# Patient Record
Sex: Female | Born: 1945 | Race: Black or African American | Hispanic: No | Marital: Married | Smoking: Never smoker
Health system: Southern US, Community
[De-identification: ages and names within clinical notes are randomized; demographics above are authoritative.]

## PROBLEM LIST (undated history)

## (undated) DIAGNOSIS — J9690 Respiratory failure, unspecified, unspecified whether with hypoxia or hypercapnia: Secondary | ICD-10-CM

## (undated) HISTORY — PX: TRACHEOSTOMY: SUR1362

---

## 2004-04-04 ENCOUNTER — Emergency Department (HOSPITAL_COMMUNITY): Admission: EM | Admit: 2004-04-04 | Discharge: 2004-04-04 | Payer: Self-pay | Admitting: Emergency Medicine

## 2010-01-24 ENCOUNTER — Ambulatory Visit: Payer: Self-pay | Admitting: Radiology

## 2010-01-24 ENCOUNTER — Emergency Department (HOSPITAL_BASED_OUTPATIENT_CLINIC_OR_DEPARTMENT_OTHER): Admission: EM | Admit: 2010-01-24 | Discharge: 2010-01-24 | Payer: Self-pay | Admitting: Emergency Medicine

## 2013-10-10 ENCOUNTER — Encounter: Payer: Self-pay | Admitting: Podiatry

## 2013-10-10 ENCOUNTER — Ambulatory Visit (INDEPENDENT_AMBULATORY_CARE_PROVIDER_SITE_OTHER): Payer: Medicare Other | Admitting: Podiatry

## 2013-10-10 VITALS — BP 116/70 | HR 66 | Ht 64.0 in | Wt 155.0 lb

## 2013-10-10 DIAGNOSIS — M79609 Pain in unspecified limb: Secondary | ICD-10-CM

## 2013-10-10 DIAGNOSIS — M79673 Pain in unspecified foot: Secondary | ICD-10-CM | POA: Insufficient documentation

## 2013-10-10 DIAGNOSIS — M792 Neuralgia and neuritis, unspecified: Secondary | ICD-10-CM | POA: Insufficient documentation

## 2013-10-10 DIAGNOSIS — M79671 Pain in right foot: Secondary | ICD-10-CM

## 2013-10-10 DIAGNOSIS — G5790 Unspecified mononeuropathy of unspecified lower limb: Secondary | ICD-10-CM

## 2013-10-10 NOTE — Progress Notes (Signed)
Subjective: Has had left ankle surgery following a injury in May 2014.  Two days ago she was in extreme pain on right foot at 8 pm as if someone is stabbing the top of right foot. She placed Ice bag and had no more problem. It happened again yesterday at 7 pm. This morning it happened 5:45 am. She used Ice bag with Alcohol and water (3:1), moldable. It was 100 times worse pain than the broken ankle.  Also had right 4th toe injury years ago that left it numb for the last 20 years. Since this painful episode the feeling came back in the 4th toe right.   Objective: Mildly swollen left ankle following the last surgery. Right foot examination reveal forefoot varus, hypermobile first ray, plantar calluses under the lesser MPJ. No visible edema or discoloration on right foot affected distal 1/2 of 5th Metatarsal shaft.  Normal range of motion. Neurovascular status are within normal.  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  Plan:  Reviewed clinical findings and available treatment options. Metatarsal binder dispensed to stabilize the first ray. Will benefit from Custom orthotics.

## 2013-10-10 NOTE — Patient Instructions (Signed)
Seen for right foot pain. Possible Neuralgia of digital branch of lateral dorsal cutaneous nerve 4th intermetatarsal space secondary to unstable first ray. May benefit from Metatarsal binder and Orthotic shoe inserts.

## 2014-10-27 ENCOUNTER — Ambulatory Visit (INDEPENDENT_AMBULATORY_CARE_PROVIDER_SITE_OTHER): Payer: Medicare Other | Admitting: Physical Therapy

## 2014-10-27 DIAGNOSIS — R5381 Other malaise: Secondary | ICD-10-CM

## 2014-10-27 DIAGNOSIS — M25519 Pain in unspecified shoulder: Secondary | ICD-10-CM

## 2014-10-27 DIAGNOSIS — M129 Arthropathy, unspecified: Secondary | ICD-10-CM | POA: Diagnosis present

## 2014-10-30 ENCOUNTER — Encounter (INDEPENDENT_AMBULATORY_CARE_PROVIDER_SITE_OTHER): Payer: Medicare Other | Admitting: Physical Therapy

## 2014-10-30 DIAGNOSIS — M25519 Pain in unspecified shoulder: Secondary | ICD-10-CM

## 2014-10-30 DIAGNOSIS — M129 Arthropathy, unspecified: Secondary | ICD-10-CM

## 2014-10-30 DIAGNOSIS — R5381 Other malaise: Secondary | ICD-10-CM

## 2014-11-03 ENCOUNTER — Encounter (INDEPENDENT_AMBULATORY_CARE_PROVIDER_SITE_OTHER): Payer: Medicare Other | Admitting: Physical Therapy

## 2014-11-03 DIAGNOSIS — M25519 Pain in unspecified shoulder: Secondary | ICD-10-CM

## 2014-11-03 DIAGNOSIS — R5381 Other malaise: Secondary | ICD-10-CM

## 2014-11-03 DIAGNOSIS — M129 Arthropathy, unspecified: Secondary | ICD-10-CM | POA: Diagnosis present

## 2014-11-05 ENCOUNTER — Encounter: Payer: Medicare Other | Admitting: Physical Therapy

## 2014-11-06 ENCOUNTER — Encounter (INDEPENDENT_AMBULATORY_CARE_PROVIDER_SITE_OTHER): Payer: Medicare Other | Admitting: Physical Therapy

## 2014-11-06 DIAGNOSIS — M129 Arthropathy, unspecified: Secondary | ICD-10-CM

## 2014-11-06 DIAGNOSIS — M25519 Pain in unspecified shoulder: Secondary | ICD-10-CM | POA: Diagnosis not present

## 2014-11-06 DIAGNOSIS — R5381 Other malaise: Secondary | ICD-10-CM

## 2014-11-11 ENCOUNTER — Encounter (INDEPENDENT_AMBULATORY_CARE_PROVIDER_SITE_OTHER): Payer: Medicare Other | Admitting: Physical Therapy

## 2014-11-11 DIAGNOSIS — M25519 Pain in unspecified shoulder: Secondary | ICD-10-CM | POA: Diagnosis not present

## 2014-11-11 DIAGNOSIS — M129 Arthropathy, unspecified: Secondary | ICD-10-CM

## 2014-11-11 DIAGNOSIS — R5381 Other malaise: Secondary | ICD-10-CM | POA: Diagnosis not present

## 2014-11-13 ENCOUNTER — Encounter (INDEPENDENT_AMBULATORY_CARE_PROVIDER_SITE_OTHER): Payer: Medicare Other | Admitting: Physical Therapy

## 2014-11-13 DIAGNOSIS — M25519 Pain in unspecified shoulder: Secondary | ICD-10-CM | POA: Diagnosis not present

## 2014-11-13 DIAGNOSIS — M129 Arthropathy, unspecified: Secondary | ICD-10-CM

## 2014-11-13 DIAGNOSIS — R5381 Other malaise: Secondary | ICD-10-CM | POA: Diagnosis not present

## 2014-11-25 ENCOUNTER — Encounter: Payer: Medicare Other | Admitting: Physical Therapy

## 2014-11-27 ENCOUNTER — Encounter (INDEPENDENT_AMBULATORY_CARE_PROVIDER_SITE_OTHER): Payer: Medicare Other | Admitting: Physical Therapy

## 2014-11-27 DIAGNOSIS — R5381 Other malaise: Secondary | ICD-10-CM

## 2014-11-27 DIAGNOSIS — M25519 Pain in unspecified shoulder: Secondary | ICD-10-CM

## 2014-11-27 DIAGNOSIS — M129 Arthropathy, unspecified: Secondary | ICD-10-CM | POA: Diagnosis not present

## 2014-12-02 ENCOUNTER — Encounter (INDEPENDENT_AMBULATORY_CARE_PROVIDER_SITE_OTHER): Payer: Medicare Other | Admitting: Physical Therapy

## 2014-12-02 DIAGNOSIS — M25519 Pain in unspecified shoulder: Secondary | ICD-10-CM

## 2014-12-02 DIAGNOSIS — M129 Arthropathy, unspecified: Secondary | ICD-10-CM | POA: Diagnosis present

## 2014-12-02 DIAGNOSIS — R5381 Other malaise: Secondary | ICD-10-CM

## 2014-12-04 ENCOUNTER — Encounter (INDEPENDENT_AMBULATORY_CARE_PROVIDER_SITE_OTHER): Payer: Medicare Other | Admitting: Physical Therapy

## 2014-12-04 DIAGNOSIS — M129 Arthropathy, unspecified: Secondary | ICD-10-CM | POA: Diagnosis not present

## 2014-12-04 DIAGNOSIS — R5381 Other malaise: Secondary | ICD-10-CM

## 2014-12-04 DIAGNOSIS — M25519 Pain in unspecified shoulder: Secondary | ICD-10-CM | POA: Diagnosis not present

## 2014-12-08 ENCOUNTER — Encounter (INDEPENDENT_AMBULATORY_CARE_PROVIDER_SITE_OTHER): Payer: Medicare Other | Admitting: Physical Therapy

## 2014-12-08 DIAGNOSIS — M129 Arthropathy, unspecified: Secondary | ICD-10-CM

## 2014-12-08 DIAGNOSIS — M25519 Pain in unspecified shoulder: Secondary | ICD-10-CM | POA: Diagnosis not present

## 2014-12-08 DIAGNOSIS — M5381 Other specified dorsopathies, occipito-atlanto-axial region: Secondary | ICD-10-CM | POA: Diagnosis not present

## 2014-12-10 ENCOUNTER — Encounter (INDEPENDENT_AMBULATORY_CARE_PROVIDER_SITE_OTHER): Payer: Medicare Other | Admitting: Physical Therapy

## 2014-12-10 DIAGNOSIS — M25519 Pain in unspecified shoulder: Secondary | ICD-10-CM | POA: Diagnosis not present

## 2014-12-10 DIAGNOSIS — M129 Arthropathy, unspecified: Secondary | ICD-10-CM

## 2014-12-10 DIAGNOSIS — R5381 Other malaise: Secondary | ICD-10-CM | POA: Diagnosis not present

## 2016-09-18 ENCOUNTER — Other Ambulatory Visit: Payer: Self-pay | Admitting: Cardiology

## 2016-09-18 DIAGNOSIS — I720 Aneurysm of carotid artery: Secondary | ICD-10-CM

## 2016-10-02 ENCOUNTER — Ambulatory Visit
Admission: RE | Admit: 2016-10-02 | Discharge: 2016-10-02 | Disposition: A | Discharge status: Home / Self Care | Payer: Medicare Other | Source: Ambulatory Visit | Attending: Cardiology | Admitting: Cardiology

## 2016-10-02 DIAGNOSIS — I720 Aneurysm of carotid artery: Secondary | ICD-10-CM

## 2017-12-05 IMAGING — US US CAROTID DUPLEX BILAT
1 series · 13 of 24 positions shown · non-contrast
Comparison: None.

CLINICAL DATA: Pulsatile area in the right neck. Evaluate for
possible aneurysm.

EXAM:
BILATERAL CAROTID DUPLEX ULTRASOUND
TECHNIQUE: Gray scale imaging, color Doppler and duplex ultrasound were
performed of bilateral carotid and vertebral arteries in the neck.

[Series 1: us carotid duplex bilat · 0.08mm/px · 13 of 62 slices shown]
[im 1/62]
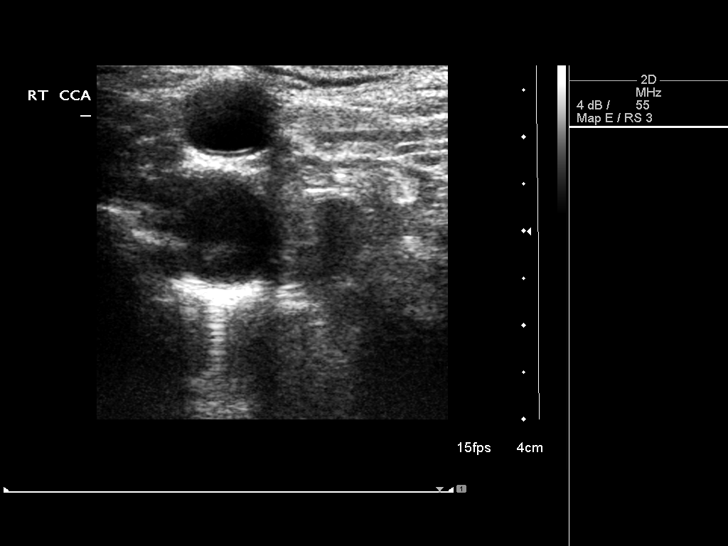
[im 6/62]
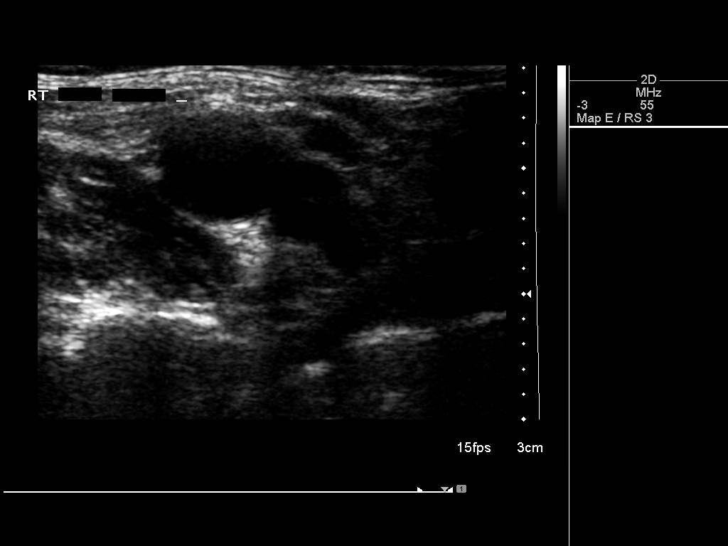
[im 11/62]
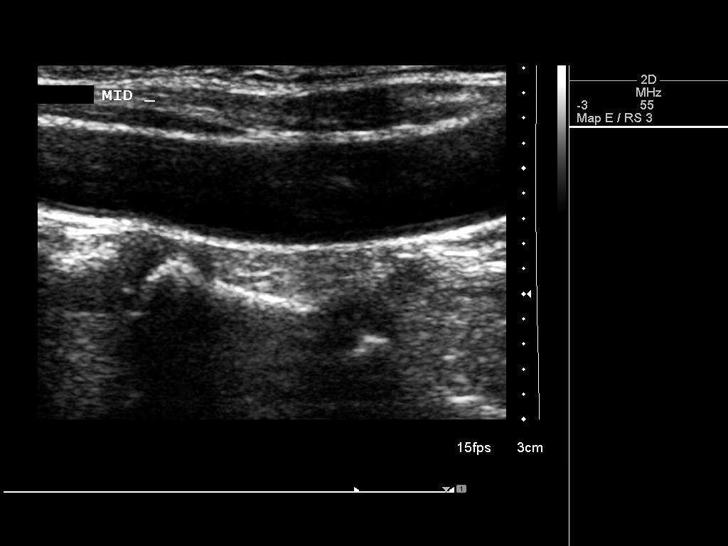
[im 16/62]
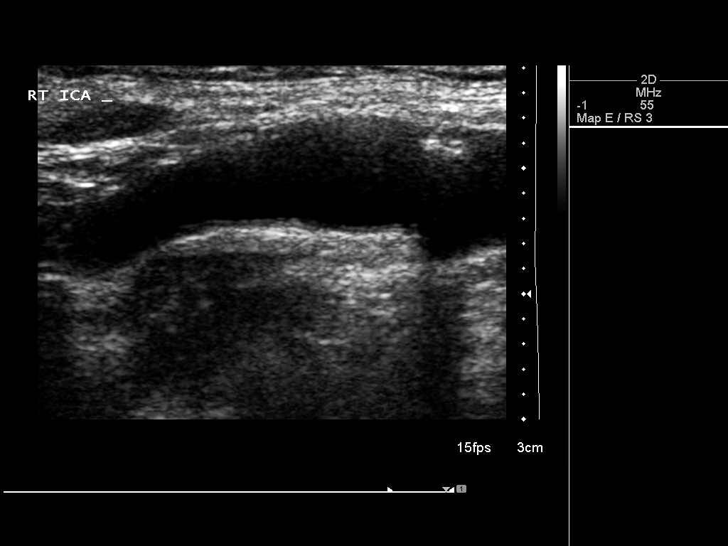
[im 22/62]
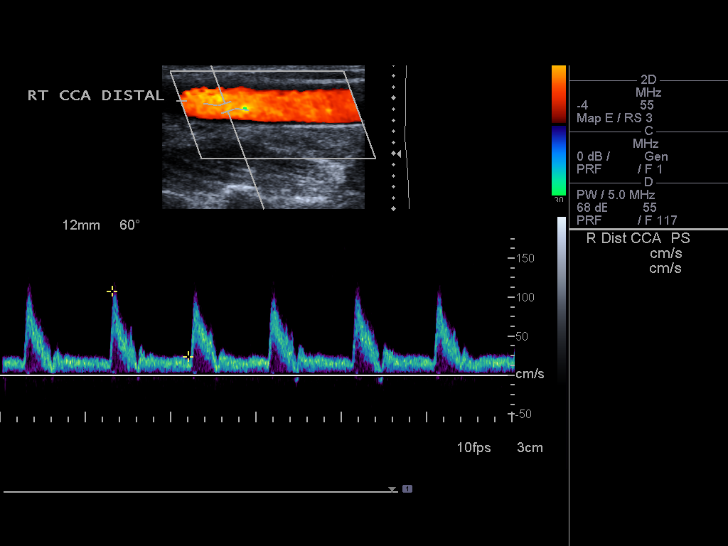
[im 27/62]
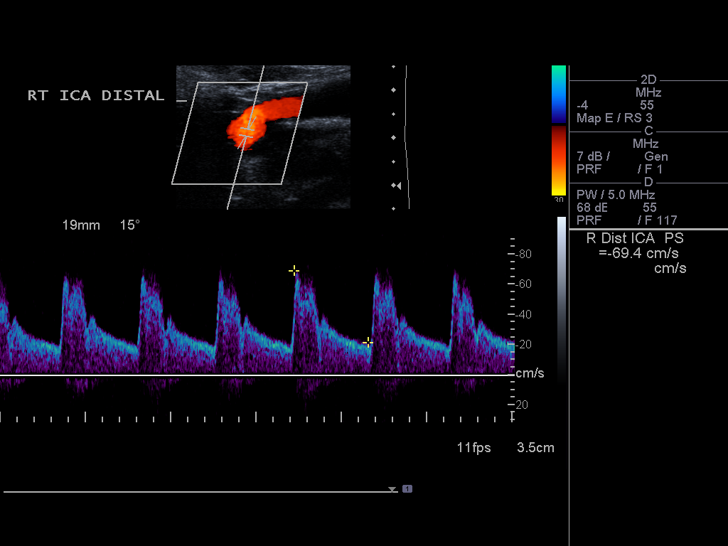
[im 32/62]
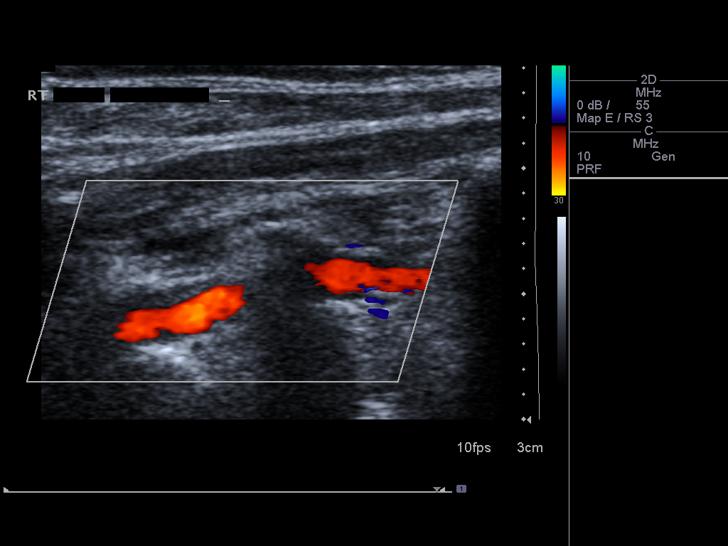
[im 35/62]
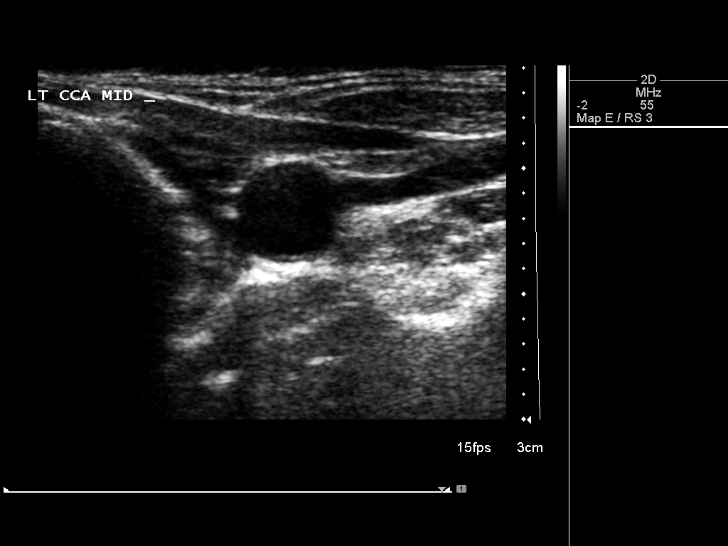
[im 40/62]
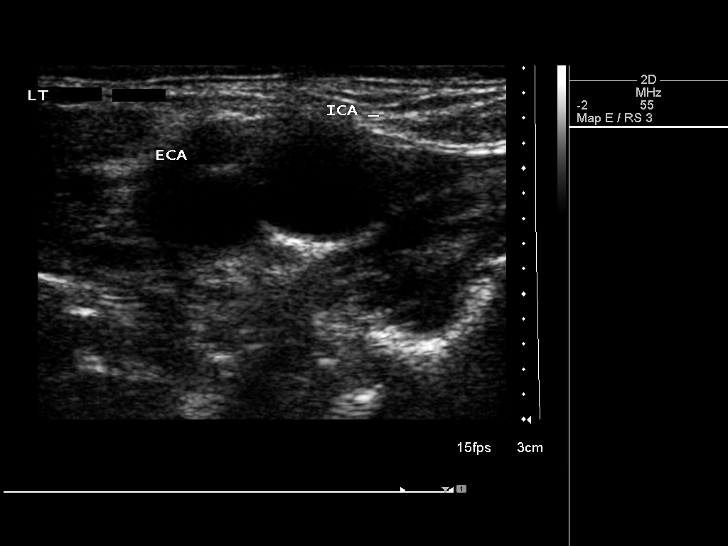
[im 46/62]
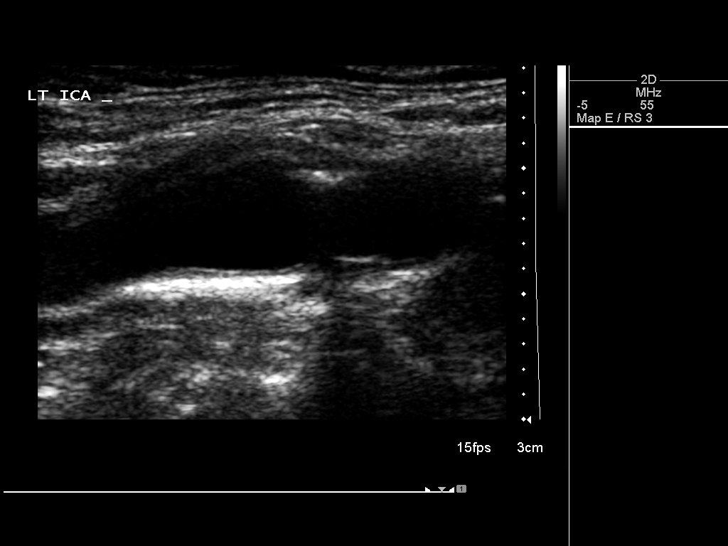
[im 51/62]
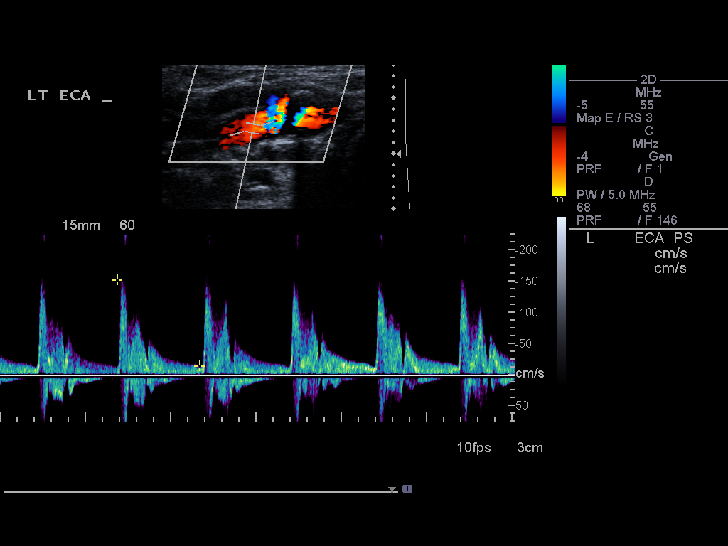
[im 56/62]
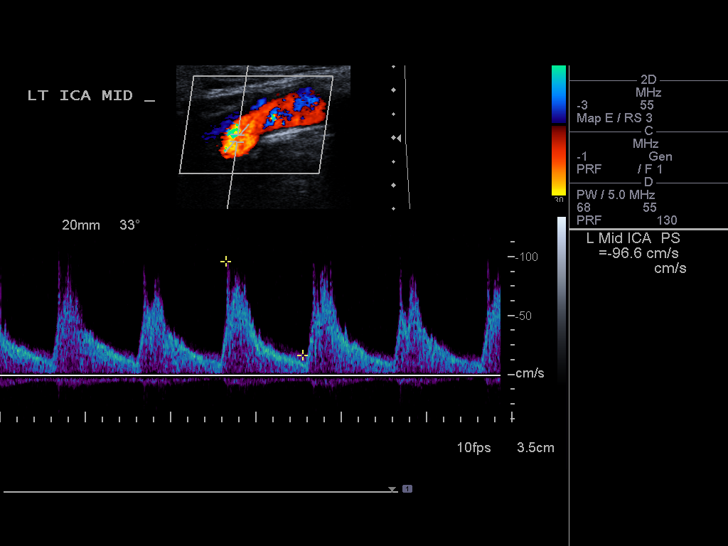
[im 62/62]
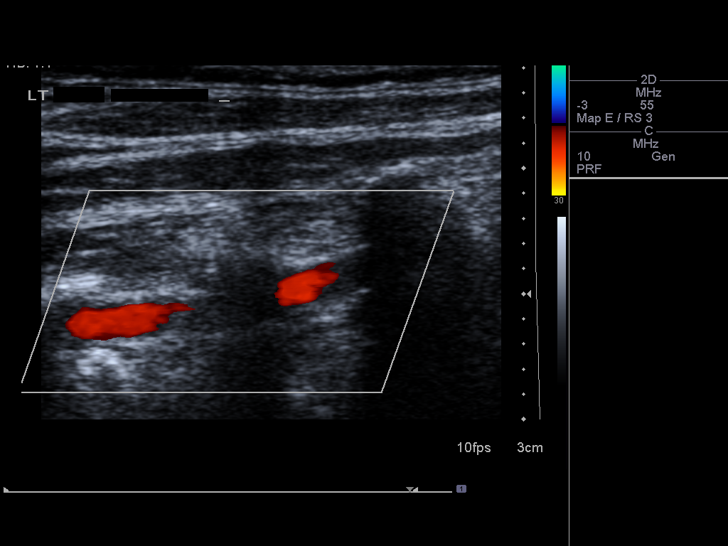

[13 of 24 positions shown; findings below may reference images not displayed]

FINDINGS: Criteria: Quantification of carotid stenosis is based on velocity
parameters that correlate the residual internal carotid diameter
with NASCET-based stenosis levels, using the diameter of the distal
internal carotid lumen as the denominator for stenosis measurement.

The following velocity measurements were obtained:

RIGHT

ICA:  93 cm/sec

CCA:  122 cm/sec

SYSTOLIC ICA/CCA RATIO:

DIASTOLIC ICA/CCA RATIO:

ECA:  93 cm/sec

LEFT

ICA:  97 cm/sec

CCA:  96 cm/sec

SYSTOLIC ICA/CCA RATIO:

DIASTOLIC ICA/CCA RATIO:

ECA:  156 cm/sec

RIGHT CAROTID ARTERY: Small amount of echogenic plaque at the right
carotid bulb and proximal internal carotid artery. Normal waveforms
and velocities in the right internal carotid artery. The pulsatile
area corresponds with the proximal common carotid artery. The right
common carotid artery is superficial at this location but there is
no evidence for an aneurysm.

RIGHT VERTEBRAL ARTERY: Antegrade flow and normal waveform in the
right vertebral artery.

LEFT CAROTID ARTERY: Small amount of echogenic plaque at the left
carotid bulb and proximal internal carotid artery. Normal waveforms
and velocities in the left internal carotid artery.

LEFT VERTEBRAL ARTERY: Antegrade flow and normal waveform in the
left vertebral artery.
IMPRESSION: Mild atherosclerotic disease in the carotid arteries. Estimated
degree of stenosis in the internal carotid arteries is less than 50%
bilaterally.

Patent vertebral arteries with antegrade flow.

The proximal right common carotid artery corresponds with pulsatile
area on the right side of the neck. The artery superficial in this
location but no aneurysm.

## 2018-05-01 ENCOUNTER — Encounter: Payer: Self-pay | Admitting: Podiatry

## 2018-05-01 ENCOUNTER — Ambulatory Visit (INDEPENDENT_AMBULATORY_CARE_PROVIDER_SITE_OTHER): Payer: Medicare Other | Admitting: Podiatry

## 2018-05-01 VITALS — BP 169/77 | HR 78 | Ht 64.0 in | Wt 162.0 lb

## 2018-05-01 DIAGNOSIS — M79671 Pain in right foot: Secondary | ICD-10-CM | POA: Diagnosis not present

## 2018-05-01 DIAGNOSIS — M2041 Other hammer toe(s) (acquired), right foot: Secondary | ICD-10-CM

## 2018-05-01 DIAGNOSIS — M79672 Pain in left foot: Secondary | ICD-10-CM

## 2018-05-01 NOTE — Patient Instructions (Signed)
Seen for digital deformities. Noted of mild ankle edema left, contracted 2nd and 4th right, recurrent digital contracture 5th bilateral.

## 2018-05-01 NOTE — Progress Notes (Signed)
Subjective: 72 year old female presents requesting foot examination for aching feet. Having black marks that started in March since wearing boots. Patient states the 2nd and 4th toe right foot are contracted and hurts in shoes.  Recent surgical history of Uterine polyps removal in August 21, 2017 under general anesthesia. Just had colonoscopy done. Under medical management for severe Hypertension. Today was 169/77.  HPI: Left ankle surgery following a injury in May 2014.  She was seen on 10/10/13 for right foot pain. She was diagnosed as a Neuralgia of dorsolateral cutaneous nerve of digital branch 4th intermetatarsal space right foot and treated with Metatarsal binder with recommendation of custom orthotics. She failed to return. Positive for right 4th toe injury years ago that left it numb for the last 20 years. Since then the feeling has come back.  Review of Systems  Constitutional: Negative.   HENT: Negative.   Eyes: Negative.   Respiratory: Negative.   Cardiovascular: Negative for chest pain, palpitations and orthopnea.       Diagnosed with Atrial tachycardia.  Gastrointestinal: Negative.   Genitourinary: Negative.   Musculoskeletal:       Post operative left ankle edema. Plate and screw fixation on left ankle following injury May, 2014.  Skin: Negative.    Objective: Dermatologic: No open skin lesions.. Vascular: All pedal pulses are palpable in normal pulsation. Mild ankle edema left, asymptomatic Neurologic: All epicritic and tactile sensations are grossly intact.  Orthopedic: S/P Hammer toe repair 5th with recurrent contracture. Multiple digital contracture, 2nd and 4th right. Forefoot varus, hypermobile first ray bilateral. Range of motion of forefoot and rearfoot are within normal. Muscle strength is normal in forefoot and rearfoot.  Radiographic examination reveal elevated first ray bilateral. Digital contracture lesser digits 2-5 bilateral. No acute changes in  osseous or articular surfaces bilateral.  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 Mild Hallux valgus with bunion bilateral.  Plan:  Reviewed clinical findings and available treatment options. As per request, surgery consent form reviewed for Hammer toe repair 2nd and 4 th bilateral.

## 2018-05-06 ENCOUNTER — Telehealth: Payer: Self-pay | Admitting: *Deleted

## 2018-05-06 NOTE — Telephone Encounter (Signed)
Patient has medicare and BCBS supplement. The supplement covers deductible and coinsurance. Medicare covers 80%. Went on [employer] website and for CPT code 16109 the estimated cost patient pays in an outpatient facility is $285.

## 2018-05-07 NOTE — Telephone Encounter (Signed)
Supplement covers $185 deductible and 20% medicare approved amount and 100%of the amount billed by doctor approved by medicare

## 2018-05-09 NOTE — Telephone Encounter (Signed)
Called patient 05/13 and today 05/09/2018 and left messages to go over benefits

## 2018-05-17 NOTE — Telephone Encounter (Signed)
Patient called and left a message earlier this week stating she could not do the surgery at this time. She will call when she is ready to have the surgery

## 2024-04-03 DIAGNOSIS — R5381 Other malaise: Secondary | ICD-10-CM

## 2024-04-03 DIAGNOSIS — J189 Pneumonia, unspecified organism: Secondary | ICD-10-CM | POA: Diagnosis not present

## 2024-04-03 DIAGNOSIS — J9602 Acute respiratory failure with hypercapnia: Secondary | ICD-10-CM | POA: Diagnosis not present

## 2024-04-03 DIAGNOSIS — G1221 Amyotrophic lateral sclerosis: Secondary | ICD-10-CM | POA: Diagnosis not present

## 2024-04-03 DIAGNOSIS — E43 Unspecified severe protein-calorie malnutrition: Secondary | ICD-10-CM

## 2024-04-03 DIAGNOSIS — R627 Adult failure to thrive: Secondary | ICD-10-CM | POA: Diagnosis not present

## 2024-04-04 DIAGNOSIS — J189 Pneumonia, unspecified organism: Secondary | ICD-10-CM | POA: Diagnosis not present

## 2024-04-04 DIAGNOSIS — J9602 Acute respiratory failure with hypercapnia: Secondary | ICD-10-CM | POA: Diagnosis not present

## 2024-04-04 DIAGNOSIS — G1221 Amyotrophic lateral sclerosis: Secondary | ICD-10-CM | POA: Diagnosis not present

## 2024-04-04 DIAGNOSIS — R627 Adult failure to thrive: Secondary | ICD-10-CM | POA: Diagnosis not present

## 2024-04-05 DIAGNOSIS — G1221 Amyotrophic lateral sclerosis: Secondary | ICD-10-CM | POA: Diagnosis not present

## 2024-04-05 DIAGNOSIS — J9602 Acute respiratory failure with hypercapnia: Secondary | ICD-10-CM | POA: Diagnosis not present

## 2024-04-05 DIAGNOSIS — J189 Pneumonia, unspecified organism: Secondary | ICD-10-CM | POA: Diagnosis not present

## 2024-04-05 DIAGNOSIS — R627 Adult failure to thrive: Secondary | ICD-10-CM | POA: Diagnosis not present

## 2024-04-06 DIAGNOSIS — J189 Pneumonia, unspecified organism: Secondary | ICD-10-CM | POA: Diagnosis not present

## 2024-04-06 DIAGNOSIS — J9602 Acute respiratory failure with hypercapnia: Secondary | ICD-10-CM | POA: Diagnosis not present

## 2024-04-06 DIAGNOSIS — R627 Adult failure to thrive: Secondary | ICD-10-CM | POA: Diagnosis not present

## 2024-04-06 DIAGNOSIS — G1221 Amyotrophic lateral sclerosis: Secondary | ICD-10-CM | POA: Diagnosis not present

## 2024-04-07 DIAGNOSIS — R627 Adult failure to thrive: Secondary | ICD-10-CM | POA: Diagnosis not present

## 2024-04-07 DIAGNOSIS — G1221 Amyotrophic lateral sclerosis: Secondary | ICD-10-CM

## 2024-04-07 DIAGNOSIS — R5381 Other malaise: Secondary | ICD-10-CM

## 2024-04-07 DIAGNOSIS — J189 Pneumonia, unspecified organism: Secondary | ICD-10-CM | POA: Diagnosis not present

## 2024-04-07 DIAGNOSIS — Z930 Tracheostomy status: Secondary | ICD-10-CM | POA: Diagnosis not present

## 2024-04-07 DIAGNOSIS — J9602 Acute respiratory failure with hypercapnia: Secondary | ICD-10-CM

## 2024-04-07 DIAGNOSIS — J9621 Acute and chronic respiratory failure with hypoxia: Secondary | ICD-10-CM | POA: Diagnosis not present

## 2024-04-07 DIAGNOSIS — E43 Unspecified severe protein-calorie malnutrition: Secondary | ICD-10-CM | POA: Diagnosis not present

## 2024-04-08 DIAGNOSIS — R627 Adult failure to thrive: Secondary | ICD-10-CM | POA: Diagnosis not present

## 2024-04-08 DIAGNOSIS — E43 Unspecified severe protein-calorie malnutrition: Secondary | ICD-10-CM | POA: Diagnosis not present

## 2024-04-08 DIAGNOSIS — G1221 Amyotrophic lateral sclerosis: Secondary | ICD-10-CM

## 2024-04-08 DIAGNOSIS — Z930 Tracheostomy status: Secondary | ICD-10-CM | POA: Diagnosis not present

## 2024-04-08 DIAGNOSIS — J9621 Acute and chronic respiratory failure with hypoxia: Secondary | ICD-10-CM | POA: Diagnosis not present

## 2024-04-08 DIAGNOSIS — J189 Pneumonia, unspecified organism: Secondary | ICD-10-CM | POA: Diagnosis not present

## 2024-04-09 DIAGNOSIS — E43 Unspecified severe protein-calorie malnutrition: Secondary | ICD-10-CM | POA: Diagnosis not present

## 2024-04-09 DIAGNOSIS — J189 Pneumonia, unspecified organism: Secondary | ICD-10-CM | POA: Diagnosis not present

## 2024-04-09 DIAGNOSIS — R627 Adult failure to thrive: Secondary | ICD-10-CM | POA: Diagnosis not present

## 2024-04-09 DIAGNOSIS — Z930 Tracheostomy status: Secondary | ICD-10-CM | POA: Diagnosis not present

## 2024-04-09 DIAGNOSIS — J9621 Acute and chronic respiratory failure with hypoxia: Secondary | ICD-10-CM | POA: Diagnosis not present

## 2024-04-09 DIAGNOSIS — G1221 Amyotrophic lateral sclerosis: Secondary | ICD-10-CM

## 2024-04-10 DIAGNOSIS — J9621 Acute and chronic respiratory failure with hypoxia: Secondary | ICD-10-CM | POA: Diagnosis not present

## 2024-04-10 DIAGNOSIS — Z930 Tracheostomy status: Secondary | ICD-10-CM | POA: Diagnosis not present

## 2024-04-10 DIAGNOSIS — E43 Unspecified severe protein-calorie malnutrition: Secondary | ICD-10-CM | POA: Diagnosis not present

## 2024-04-10 DIAGNOSIS — R627 Adult failure to thrive: Secondary | ICD-10-CM | POA: Diagnosis not present

## 2024-04-10 DIAGNOSIS — G1221 Amyotrophic lateral sclerosis: Secondary | ICD-10-CM

## 2024-04-10 DIAGNOSIS — J189 Pneumonia, unspecified organism: Secondary | ICD-10-CM | POA: Diagnosis not present

## 2024-04-11 DIAGNOSIS — J9621 Acute and chronic respiratory failure with hypoxia: Secondary | ICD-10-CM | POA: Diagnosis not present

## 2024-04-11 DIAGNOSIS — E43 Unspecified severe protein-calorie malnutrition: Secondary | ICD-10-CM | POA: Diagnosis not present

## 2024-04-11 DIAGNOSIS — G1221 Amyotrophic lateral sclerosis: Secondary | ICD-10-CM

## 2024-04-11 DIAGNOSIS — J189 Pneumonia, unspecified organism: Secondary | ICD-10-CM | POA: Diagnosis not present

## 2024-04-11 DIAGNOSIS — R627 Adult failure to thrive: Secondary | ICD-10-CM | POA: Diagnosis not present

## 2024-04-11 DIAGNOSIS — Z930 Tracheostomy status: Secondary | ICD-10-CM | POA: Diagnosis not present

## 2024-04-12 DIAGNOSIS — Z930 Tracheostomy status: Secondary | ICD-10-CM | POA: Diagnosis not present

## 2024-04-12 DIAGNOSIS — J9621 Acute and chronic respiratory failure with hypoxia: Secondary | ICD-10-CM | POA: Diagnosis not present

## 2024-04-12 DIAGNOSIS — G1221 Amyotrophic lateral sclerosis: Secondary | ICD-10-CM

## 2024-04-12 DIAGNOSIS — R627 Adult failure to thrive: Secondary | ICD-10-CM | POA: Diagnosis not present

## 2024-04-12 DIAGNOSIS — J189 Pneumonia, unspecified organism: Secondary | ICD-10-CM | POA: Diagnosis not present

## 2024-04-12 DIAGNOSIS — E43 Unspecified severe protein-calorie malnutrition: Secondary | ICD-10-CM | POA: Diagnosis not present

## 2024-04-13 DIAGNOSIS — Z930 Tracheostomy status: Secondary | ICD-10-CM | POA: Diagnosis not present

## 2024-04-13 DIAGNOSIS — R627 Adult failure to thrive: Secondary | ICD-10-CM | POA: Diagnosis not present

## 2024-04-13 DIAGNOSIS — G1221 Amyotrophic lateral sclerosis: Secondary | ICD-10-CM

## 2024-04-13 DIAGNOSIS — J9621 Acute and chronic respiratory failure with hypoxia: Secondary | ICD-10-CM | POA: Diagnosis not present

## 2024-04-13 DIAGNOSIS — J189 Pneumonia, unspecified organism: Secondary | ICD-10-CM | POA: Diagnosis not present

## 2024-04-13 DIAGNOSIS — E43 Unspecified severe protein-calorie malnutrition: Secondary | ICD-10-CM | POA: Diagnosis not present

## 2024-04-14 DIAGNOSIS — J9602 Acute respiratory failure with hypercapnia: Secondary | ICD-10-CM

## 2024-04-14 DIAGNOSIS — G1221 Amyotrophic lateral sclerosis: Secondary | ICD-10-CM | POA: Diagnosis not present

## 2024-04-14 DIAGNOSIS — R627 Adult failure to thrive: Secondary | ICD-10-CM | POA: Diagnosis not present

## 2024-04-14 DIAGNOSIS — R5381 Other malaise: Secondary | ICD-10-CM

## 2024-04-14 DIAGNOSIS — J189 Pneumonia, unspecified organism: Secondary | ICD-10-CM | POA: Diagnosis not present

## 2024-04-14 DIAGNOSIS — E43 Unspecified severe protein-calorie malnutrition: Secondary | ICD-10-CM | POA: Diagnosis not present

## 2024-04-15 DIAGNOSIS — E43 Unspecified severe protein-calorie malnutrition: Secondary | ICD-10-CM | POA: Diagnosis not present

## 2024-04-15 DIAGNOSIS — R627 Adult failure to thrive: Secondary | ICD-10-CM | POA: Diagnosis not present

## 2024-04-15 DIAGNOSIS — J189 Pneumonia, unspecified organism: Secondary | ICD-10-CM | POA: Diagnosis not present

## 2024-04-15 DIAGNOSIS — G1221 Amyotrophic lateral sclerosis: Secondary | ICD-10-CM | POA: Diagnosis not present

## 2024-04-16 DIAGNOSIS — G1221 Amyotrophic lateral sclerosis: Secondary | ICD-10-CM | POA: Diagnosis not present

## 2024-04-16 DIAGNOSIS — R627 Adult failure to thrive: Secondary | ICD-10-CM | POA: Diagnosis not present

## 2024-04-16 DIAGNOSIS — E43 Unspecified severe protein-calorie malnutrition: Secondary | ICD-10-CM | POA: Diagnosis not present

## 2024-04-16 DIAGNOSIS — J189 Pneumonia, unspecified organism: Secondary | ICD-10-CM | POA: Diagnosis not present

## 2024-04-17 DIAGNOSIS — J189 Pneumonia, unspecified organism: Secondary | ICD-10-CM | POA: Diagnosis not present

## 2024-04-17 DIAGNOSIS — E43 Unspecified severe protein-calorie malnutrition: Secondary | ICD-10-CM | POA: Diagnosis not present

## 2024-04-17 DIAGNOSIS — R627 Adult failure to thrive: Secondary | ICD-10-CM | POA: Diagnosis not present

## 2024-04-17 DIAGNOSIS — G1221 Amyotrophic lateral sclerosis: Secondary | ICD-10-CM | POA: Diagnosis not present

## 2024-04-18 DIAGNOSIS — J189 Pneumonia, unspecified organism: Secondary | ICD-10-CM | POA: Diagnosis not present

## 2024-04-18 DIAGNOSIS — R627 Adult failure to thrive: Secondary | ICD-10-CM | POA: Diagnosis not present

## 2024-04-18 DIAGNOSIS — G1221 Amyotrophic lateral sclerosis: Secondary | ICD-10-CM | POA: Diagnosis not present

## 2024-04-18 DIAGNOSIS — E43 Unspecified severe protein-calorie malnutrition: Secondary | ICD-10-CM | POA: Diagnosis not present

## 2024-04-19 DIAGNOSIS — E43 Unspecified severe protein-calorie malnutrition: Secondary | ICD-10-CM | POA: Diagnosis not present

## 2024-04-19 DIAGNOSIS — G1221 Amyotrophic lateral sclerosis: Secondary | ICD-10-CM | POA: Diagnosis not present

## 2024-04-19 DIAGNOSIS — R627 Adult failure to thrive: Secondary | ICD-10-CM | POA: Diagnosis not present

## 2024-04-19 DIAGNOSIS — J189 Pneumonia, unspecified organism: Secondary | ICD-10-CM | POA: Diagnosis not present

## 2024-04-20 DIAGNOSIS — E43 Unspecified severe protein-calorie malnutrition: Secondary | ICD-10-CM | POA: Diagnosis not present

## 2024-04-20 DIAGNOSIS — G1221 Amyotrophic lateral sclerosis: Secondary | ICD-10-CM | POA: Diagnosis not present

## 2024-04-20 DIAGNOSIS — R627 Adult failure to thrive: Secondary | ICD-10-CM | POA: Diagnosis not present

## 2024-04-20 DIAGNOSIS — J189 Pneumonia, unspecified organism: Secondary | ICD-10-CM | POA: Diagnosis not present

## 2024-04-21 DIAGNOSIS — R5381 Other malaise: Secondary | ICD-10-CM

## 2024-04-21 DIAGNOSIS — J9621 Acute and chronic respiratory failure with hypoxia: Secondary | ICD-10-CM | POA: Diagnosis not present

## 2024-04-21 DIAGNOSIS — J9602 Acute respiratory failure with hypercapnia: Secondary | ICD-10-CM

## 2024-04-21 DIAGNOSIS — R627 Adult failure to thrive: Secondary | ICD-10-CM | POA: Diagnosis not present

## 2024-04-21 DIAGNOSIS — G1221 Amyotrophic lateral sclerosis: Secondary | ICD-10-CM | POA: Diagnosis not present

## 2024-04-21 DIAGNOSIS — Z930 Tracheostomy status: Secondary | ICD-10-CM | POA: Diagnosis not present

## 2024-04-21 DIAGNOSIS — J189 Pneumonia, unspecified organism: Secondary | ICD-10-CM | POA: Diagnosis not present

## 2024-04-21 DIAGNOSIS — E43 Unspecified severe protein-calorie malnutrition: Secondary | ICD-10-CM | POA: Diagnosis not present

## 2024-04-22 DIAGNOSIS — Z930 Tracheostomy status: Secondary | ICD-10-CM | POA: Diagnosis not present

## 2024-04-22 DIAGNOSIS — J9621 Acute and chronic respiratory failure with hypoxia: Secondary | ICD-10-CM | POA: Diagnosis not present

## 2024-04-22 DIAGNOSIS — G1221 Amyotrophic lateral sclerosis: Secondary | ICD-10-CM | POA: Diagnosis not present

## 2024-04-22 DIAGNOSIS — R627 Adult failure to thrive: Secondary | ICD-10-CM | POA: Diagnosis not present

## 2024-04-22 DIAGNOSIS — J189 Pneumonia, unspecified organism: Secondary | ICD-10-CM | POA: Diagnosis not present

## 2024-04-22 DIAGNOSIS — E43 Unspecified severe protein-calorie malnutrition: Secondary | ICD-10-CM | POA: Diagnosis not present

## 2024-04-23 DIAGNOSIS — Z930 Tracheostomy status: Secondary | ICD-10-CM | POA: Diagnosis not present

## 2024-04-23 DIAGNOSIS — J189 Pneumonia, unspecified organism: Secondary | ICD-10-CM | POA: Diagnosis not present

## 2024-04-23 DIAGNOSIS — G1221 Amyotrophic lateral sclerosis: Secondary | ICD-10-CM | POA: Diagnosis not present

## 2024-04-23 DIAGNOSIS — E43 Unspecified severe protein-calorie malnutrition: Secondary | ICD-10-CM | POA: Diagnosis not present

## 2024-04-23 DIAGNOSIS — R627 Adult failure to thrive: Secondary | ICD-10-CM | POA: Diagnosis not present

## 2024-04-23 DIAGNOSIS — J9621 Acute and chronic respiratory failure with hypoxia: Secondary | ICD-10-CM | POA: Diagnosis not present

## 2024-04-24 DIAGNOSIS — Z930 Tracheostomy status: Secondary | ICD-10-CM | POA: Diagnosis not present

## 2024-04-24 DIAGNOSIS — J189 Pneumonia, unspecified organism: Secondary | ICD-10-CM

## 2024-04-24 DIAGNOSIS — E43 Unspecified severe protein-calorie malnutrition: Secondary | ICD-10-CM

## 2024-04-24 DIAGNOSIS — J9602 Acute respiratory failure with hypercapnia: Secondary | ICD-10-CM

## 2024-04-24 DIAGNOSIS — J9621 Acute and chronic respiratory failure with hypoxia: Secondary | ICD-10-CM | POA: Diagnosis not present

## 2024-04-24 DIAGNOSIS — R627 Adult failure to thrive: Secondary | ICD-10-CM | POA: Diagnosis not present

## 2024-04-24 DIAGNOSIS — R5381 Other malaise: Secondary | ICD-10-CM

## 2024-04-24 DIAGNOSIS — G1221 Amyotrophic lateral sclerosis: Secondary | ICD-10-CM

## 2024-04-25 DIAGNOSIS — G1221 Amyotrophic lateral sclerosis: Secondary | ICD-10-CM

## 2024-04-25 DIAGNOSIS — Z930 Tracheostomy status: Secondary | ICD-10-CM

## 2024-04-25 DIAGNOSIS — J9621 Acute and chronic respiratory failure with hypoxia: Secondary | ICD-10-CM | POA: Diagnosis not present

## 2024-04-25 DIAGNOSIS — J189 Pneumonia, unspecified organism: Secondary | ICD-10-CM

## 2024-04-25 DIAGNOSIS — R627 Adult failure to thrive: Secondary | ICD-10-CM | POA: Diagnosis not present

## 2024-04-25 DIAGNOSIS — E43 Unspecified severe protein-calorie malnutrition: Secondary | ICD-10-CM | POA: Diagnosis not present

## 2024-04-26 DIAGNOSIS — J9621 Acute and chronic respiratory failure with hypoxia: Secondary | ICD-10-CM | POA: Diagnosis not present

## 2024-04-26 DIAGNOSIS — R627 Adult failure to thrive: Secondary | ICD-10-CM | POA: Diagnosis not present

## 2024-04-26 DIAGNOSIS — E43 Unspecified severe protein-calorie malnutrition: Secondary | ICD-10-CM | POA: Diagnosis not present

## 2024-04-26 DIAGNOSIS — J189 Pneumonia, unspecified organism: Secondary | ICD-10-CM | POA: Diagnosis not present

## 2024-04-26 DIAGNOSIS — G1221 Amyotrophic lateral sclerosis: Secondary | ICD-10-CM

## 2024-04-26 DIAGNOSIS — Z930 Tracheostomy status: Secondary | ICD-10-CM | POA: Diagnosis not present

## 2024-04-27 DIAGNOSIS — Z930 Tracheostomy status: Secondary | ICD-10-CM | POA: Diagnosis not present

## 2024-04-27 DIAGNOSIS — J9621 Acute and chronic respiratory failure with hypoxia: Secondary | ICD-10-CM | POA: Diagnosis not present

## 2024-04-27 DIAGNOSIS — R627 Adult failure to thrive: Secondary | ICD-10-CM | POA: Diagnosis not present

## 2024-04-27 DIAGNOSIS — E43 Unspecified severe protein-calorie malnutrition: Secondary | ICD-10-CM | POA: Diagnosis not present

## 2024-04-27 DIAGNOSIS — J189 Pneumonia, unspecified organism: Secondary | ICD-10-CM | POA: Diagnosis not present

## 2024-04-27 DIAGNOSIS — G1221 Amyotrophic lateral sclerosis: Secondary | ICD-10-CM

## 2024-04-28 DIAGNOSIS — G1221 Amyotrophic lateral sclerosis: Secondary | ICD-10-CM | POA: Diagnosis not present

## 2024-04-28 DIAGNOSIS — J9602 Acute respiratory failure with hypercapnia: Secondary | ICD-10-CM

## 2024-04-28 DIAGNOSIS — E43 Unspecified severe protein-calorie malnutrition: Secondary | ICD-10-CM | POA: Diagnosis not present

## 2024-04-28 DIAGNOSIS — R627 Adult failure to thrive: Secondary | ICD-10-CM | POA: Diagnosis not present

## 2024-04-28 DIAGNOSIS — J189 Pneumonia, unspecified organism: Secondary | ICD-10-CM | POA: Diagnosis not present

## 2024-04-28 DIAGNOSIS — R5381 Other malaise: Secondary | ICD-10-CM

## 2024-04-29 DIAGNOSIS — J189 Pneumonia, unspecified organism: Secondary | ICD-10-CM | POA: Diagnosis not present

## 2024-04-29 DIAGNOSIS — R627 Adult failure to thrive: Secondary | ICD-10-CM | POA: Diagnosis not present

## 2024-04-29 DIAGNOSIS — E43 Unspecified severe protein-calorie malnutrition: Secondary | ICD-10-CM | POA: Diagnosis not present

## 2024-04-29 DIAGNOSIS — G1221 Amyotrophic lateral sclerosis: Secondary | ICD-10-CM | POA: Diagnosis not present

## 2024-05-01 DIAGNOSIS — R5381 Other malaise: Secondary | ICD-10-CM

## 2024-05-01 DIAGNOSIS — G1221 Amyotrophic lateral sclerosis: Secondary | ICD-10-CM | POA: Diagnosis not present

## 2024-05-01 DIAGNOSIS — R627 Adult failure to thrive: Secondary | ICD-10-CM | POA: Diagnosis not present

## 2024-05-01 DIAGNOSIS — J9602 Acute respiratory failure with hypercapnia: Secondary | ICD-10-CM | POA: Diagnosis not present

## 2024-05-01 DIAGNOSIS — E43 Unspecified severe protein-calorie malnutrition: Secondary | ICD-10-CM | POA: Diagnosis not present

## 2024-05-06 DIAGNOSIS — G1221 Amyotrophic lateral sclerosis: Secondary | ICD-10-CM | POA: Diagnosis not present

## 2024-05-06 DIAGNOSIS — E43 Unspecified severe protein-calorie malnutrition: Secondary | ICD-10-CM | POA: Diagnosis not present

## 2024-05-06 DIAGNOSIS — R627 Adult failure to thrive: Secondary | ICD-10-CM | POA: Diagnosis not present

## 2024-05-06 DIAGNOSIS — R5381 Other malaise: Secondary | ICD-10-CM

## 2024-05-06 DIAGNOSIS — J9602 Acute respiratory failure with hypercapnia: Secondary | ICD-10-CM | POA: Diagnosis not present

## 2024-05-13 DIAGNOSIS — R5381 Other malaise: Secondary | ICD-10-CM

## 2024-05-13 DIAGNOSIS — J9602 Acute respiratory failure with hypercapnia: Secondary | ICD-10-CM | POA: Diagnosis not present

## 2024-05-13 DIAGNOSIS — G1221 Amyotrophic lateral sclerosis: Secondary | ICD-10-CM | POA: Diagnosis not present

## 2024-05-13 DIAGNOSIS — E43 Unspecified severe protein-calorie malnutrition: Secondary | ICD-10-CM | POA: Diagnosis not present

## 2024-05-13 DIAGNOSIS — R627 Adult failure to thrive: Secondary | ICD-10-CM | POA: Diagnosis not present

## 2024-05-19 DIAGNOSIS — E43 Unspecified severe protein-calorie malnutrition: Secondary | ICD-10-CM | POA: Diagnosis not present

## 2024-05-19 DIAGNOSIS — J9602 Acute respiratory failure with hypercapnia: Secondary | ICD-10-CM | POA: Diagnosis not present

## 2024-05-19 DIAGNOSIS — G1221 Amyotrophic lateral sclerosis: Secondary | ICD-10-CM | POA: Diagnosis not present

## 2024-05-19 DIAGNOSIS — R627 Adult failure to thrive: Secondary | ICD-10-CM | POA: Diagnosis not present

## 2024-05-19 DIAGNOSIS — R5381 Other malaise: Secondary | ICD-10-CM

## 2024-05-26 ENCOUNTER — Encounter (HOSPITAL_COMMUNITY): Payer: Self-pay | Admitting: Emergency Medicine

## 2024-05-26 ENCOUNTER — Emergency Department (HOSPITAL_COMMUNITY)

## 2024-05-26 ENCOUNTER — Inpatient Hospital Stay (HOSPITAL_COMMUNITY)
Admission: EM | Admit: 2024-05-26 | Discharge: 2024-05-30 | DRG: 004 | Disposition: A | Discharge status: Other Acute Inpatient Hospital | Source: Other Acute Inpatient Hospital | Attending: Internal Medicine | Admitting: Internal Medicine

## 2024-05-26 ENCOUNTER — Other Ambulatory Visit: Payer: Self-pay

## 2024-05-26 ENCOUNTER — Inpatient Hospital Stay (HOSPITAL_COMMUNITY)

## 2024-05-26 DIAGNOSIS — R627 Adult failure to thrive: Secondary | ICD-10-CM | POA: Diagnosis present

## 2024-05-26 DIAGNOSIS — E861 Hypovolemia: Secondary | ICD-10-CM | POA: Diagnosis present

## 2024-05-26 DIAGNOSIS — J9601 Acute respiratory failure with hypoxia: Principal | ICD-10-CM

## 2024-05-26 DIAGNOSIS — E871 Hypo-osmolality and hyponatremia: Secondary | ICD-10-CM | POA: Diagnosis present

## 2024-05-26 DIAGNOSIS — Z681 Body mass index (BMI) 19 or less, adult: Secondary | ICD-10-CM

## 2024-05-26 DIAGNOSIS — I4892 Unspecified atrial flutter: Secondary | ICD-10-CM | POA: Diagnosis present

## 2024-05-26 DIAGNOSIS — I959 Hypotension, unspecified: Secondary | ICD-10-CM

## 2024-05-26 DIAGNOSIS — J9602 Acute respiratory failure with hypercapnia: Secondary | ICD-10-CM | POA: Diagnosis not present

## 2024-05-26 DIAGNOSIS — G1221 Amyotrophic lateral sclerosis: Secondary | ICD-10-CM | POA: Insufficient documentation

## 2024-05-26 DIAGNOSIS — R578 Other shock: Secondary | ICD-10-CM | POA: Diagnosis not present

## 2024-05-26 DIAGNOSIS — G9341 Metabolic encephalopathy: Secondary | ICD-10-CM | POA: Diagnosis present

## 2024-05-26 DIAGNOSIS — Z886 Allergy status to analgesic agent status: Secondary | ICD-10-CM

## 2024-05-26 DIAGNOSIS — Z880 Allergy status to penicillin: Secondary | ICD-10-CM

## 2024-05-26 DIAGNOSIS — D72829 Elevated white blood cell count, unspecified: Secondary | ICD-10-CM | POA: Diagnosis present

## 2024-05-26 DIAGNOSIS — T4275XA Adverse effect of unspecified antiepileptic and sedative-hypnotic drugs, initial encounter: Secondary | ICD-10-CM | POA: Diagnosis not present

## 2024-05-26 DIAGNOSIS — Z888 Allergy status to other drugs, medicaments and biological substances status: Secondary | ICD-10-CM

## 2024-05-26 DIAGNOSIS — Z883 Allergy status to other anti-infective agents status: Secondary | ICD-10-CM

## 2024-05-26 DIAGNOSIS — J189 Pneumonia, unspecified organism: Secondary | ICD-10-CM | POA: Diagnosis not present

## 2024-05-26 DIAGNOSIS — R5381 Other malaise: Secondary | ICD-10-CM | POA: Diagnosis not present

## 2024-05-26 DIAGNOSIS — E878 Other disorders of electrolyte and fluid balance, not elsewhere classified: Secondary | ICD-10-CM | POA: Insufficient documentation

## 2024-05-26 DIAGNOSIS — I4719 Other supraventricular tachycardia: Secondary | ICD-10-CM | POA: Diagnosis present

## 2024-05-26 DIAGNOSIS — Z930 Tracheostomy status: Secondary | ICD-10-CM | POA: Diagnosis not present

## 2024-05-26 DIAGNOSIS — E43 Unspecified severe protein-calorie malnutrition: Secondary | ICD-10-CM | POA: Insufficient documentation

## 2024-05-26 DIAGNOSIS — I4891 Unspecified atrial fibrillation: Secondary | ICD-10-CM | POA: Diagnosis not present

## 2024-05-26 DIAGNOSIS — E16A2 Hypoglycemia level 2: Secondary | ICD-10-CM | POA: Diagnosis not present

## 2024-05-26 DIAGNOSIS — E785 Hyperlipidemia, unspecified: Secondary | ICD-10-CM | POA: Diagnosis present

## 2024-05-26 DIAGNOSIS — I10 Essential (primary) hypertension: Secondary | ICD-10-CM | POA: Diagnosis present

## 2024-05-26 DIAGNOSIS — Z91041 Radiographic dye allergy status: Secondary | ICD-10-CM

## 2024-05-26 DIAGNOSIS — J9622 Acute and chronic respiratory failure with hypercapnia: Principal | ICD-10-CM

## 2024-05-26 DIAGNOSIS — L89153 Pressure ulcer of sacral region, stage 3: Secondary | ICD-10-CM | POA: Diagnosis present

## 2024-05-26 DIAGNOSIS — I952 Hypotension due to drugs: Secondary | ICD-10-CM | POA: Insufficient documentation

## 2024-05-26 DIAGNOSIS — J9621 Acute and chronic respiratory failure with hypoxia: Secondary | ICD-10-CM | POA: Diagnosis present

## 2024-05-26 DIAGNOSIS — Z79899 Other long term (current) drug therapy: Secondary | ICD-10-CM | POA: Diagnosis not present

## 2024-05-26 DIAGNOSIS — Z9911 Dependence on respirator [ventilator] status: Secondary | ICD-10-CM

## 2024-05-26 DIAGNOSIS — R54 Age-related physical debility: Secondary | ICD-10-CM | POA: Diagnosis present

## 2024-05-26 DIAGNOSIS — L8990 Pressure ulcer of unspecified site, unspecified stage: Secondary | ICD-10-CM | POA: Insufficient documentation

## 2024-05-26 DIAGNOSIS — R571 Hypovolemic shock: Secondary | ICD-10-CM | POA: Diagnosis present

## 2024-05-26 DIAGNOSIS — Z881 Allergy status to other antibiotic agents status: Secondary | ICD-10-CM

## 2024-05-26 DIAGNOSIS — J9690 Respiratory failure, unspecified, unspecified whether with hypoxia or hypercapnia: Secondary | ICD-10-CM | POA: Diagnosis present

## 2024-05-26 DIAGNOSIS — Z1152 Encounter for screening for COVID-19: Secondary | ICD-10-CM | POA: Diagnosis not present

## 2024-05-26 DIAGNOSIS — Z87892 Personal history of anaphylaxis: Secondary | ICD-10-CM

## 2024-05-26 DIAGNOSIS — Z885 Allergy status to narcotic agent status: Secondary | ICD-10-CM

## 2024-05-26 DIAGNOSIS — L98429 Non-pressure chronic ulcer of back with unspecified severity: Secondary | ICD-10-CM | POA: Diagnosis not present

## 2024-05-26 HISTORY — DX: Respiratory failure, unspecified, unspecified whether with hypoxia or hypercapnia: J96.90

## 2024-05-26 LAB — CBC WITH DIFFERENTIAL/PLATELET
Abs Immature Granulocytes: 0.02 10*3/uL (ref 0.00–0.07)
Basophils Absolute: 0 10*3/uL (ref 0.0–0.1)
Basophils Relative: 0 %
Eosinophils Absolute: 0 10*3/uL (ref 0.0–0.5)
Eosinophils Relative: 0 %
HCT: 44.5 % (ref 36.0–46.0)
Hemoglobin: 14.3 g/dL (ref 12.0–15.0)
Immature Granulocytes: 0 %
Lymphocytes Relative: 4 %
Lymphs Abs: 0.3 10*3/uL — ABNORMAL LOW (ref 0.7–4.0)
MCH: 29.7 pg (ref 26.0–34.0)
MCHC: 32.1 g/dL (ref 30.0–36.0)
MCV: 92.5 fL (ref 80.0–100.0)
Monocytes Absolute: 0.3 10*3/uL (ref 0.1–1.0)
Monocytes Relative: 5 %
Neutro Abs: 5.3 10*3/uL (ref 1.7–7.7)
Neutrophils Relative %: 91 %
Platelets: 261 10*3/uL (ref 150–400)
RBC: 4.81 MIL/uL (ref 3.87–5.11)
RDW: 12.7 % (ref 11.5–15.5)
WBC: 5.8 10*3/uL (ref 4.0–10.5)
nRBC: 0 % (ref 0.0–0.2)

## 2024-05-26 LAB — I-STAT VENOUS BLOOD GAS, ED
Acid-Base Excess: 9 mmol/L — ABNORMAL HIGH (ref 0.0–2.0)
Bicarbonate: 42.5 mmol/L — ABNORMAL HIGH (ref 20.0–28.0)
Calcium, Ion: 1.27 mmol/L (ref 1.15–1.40)
HCT: 48 % — ABNORMAL HIGH (ref 36.0–46.0)
Hemoglobin: 16.3 g/dL — ABNORMAL HIGH (ref 12.0–15.0)
O2 Saturation: 92 %
Potassium: 4.2 mmol/L (ref 3.5–5.1)
Sodium: 129 mmol/L — ABNORMAL LOW (ref 135–145)
TCO2: 46 mmol/L — ABNORMAL HIGH (ref 22–32)
pCO2, Ven: 104.1 mmHg (ref 44–60)
pH, Ven: 7.219 — ABNORMAL LOW (ref 7.25–7.43)
pO2, Ven: 82 mmHg — ABNORMAL HIGH (ref 32–45)

## 2024-05-26 LAB — HEMOGLOBIN A1C
Hgb A1c MFr Bld: 4.9 % (ref 4.8–5.6)
Mean Plasma Glucose: 93.93 mg/dL

## 2024-05-26 LAB — POCT I-STAT 7, (LYTES, BLD GAS, ICA,H+H)
Acid-Base Excess: 6 mmol/L — ABNORMAL HIGH (ref 0.0–2.0)
Bicarbonate: 29.3 mmol/L — ABNORMAL HIGH (ref 20.0–28.0)
Calcium, Ion: 1.24 mmol/L (ref 1.15–1.40)
HCT: 33 % — ABNORMAL LOW (ref 36.0–46.0)
Hemoglobin: 11.2 g/dL — ABNORMAL LOW (ref 12.0–15.0)
O2 Saturation: 100 %
Patient temperature: 35.1
Potassium: 4 mmol/L (ref 3.5–5.1)
Sodium: 130 mmol/L — ABNORMAL LOW (ref 135–145)
TCO2: 30 mmol/L (ref 22–32)
pCO2 arterial: 34.9 mmHg (ref 32–48)
pH, Arterial: 7.525 — ABNORMAL HIGH (ref 7.35–7.45)
pO2, Arterial: 145 mmHg — ABNORMAL HIGH (ref 83–108)

## 2024-05-26 LAB — GLUCOSE, CAPILLARY
Glucose-Capillary: 118 mg/dL — ABNORMAL HIGH (ref 70–99)
Glucose-Capillary: 140 mg/dL — ABNORMAL HIGH (ref 70–99)
Glucose-Capillary: 28 mg/dL — CL (ref 70–99)
Glucose-Capillary: 31 mg/dL — CL (ref 70–99)
Glucose-Capillary: 156 mg/dL — ABNORMAL HIGH (ref 70–99)
Glucose-Capillary: 80 mg/dL (ref 70–99)

## 2024-05-26 LAB — RESPIRATORY PANEL BY PCR

## 2024-05-26 LAB — BASIC METABOLIC PANEL WITH GFR
Anion gap: 8 (ref 5–15)
BUN: 24 mg/dL — ABNORMAL HIGH (ref 8–23)
CO2: 36 mmol/L — ABNORMAL HIGH (ref 22–32)
Calcium: 9.8 mg/dL (ref 8.9–10.3)
Chloride: 84 mmol/L — ABNORMAL LOW (ref 98–111)
Creatinine, Ser: 0.33 mg/dL — ABNORMAL LOW (ref 0.44–1.00)
GFR, Estimated: >60 mL/min (ref >60)
Glucose, Bld: 134 mg/dL — ABNORMAL HIGH (ref 70–99)
Potassium: 3.9 mmol/L (ref 3.5–5.1)
Sodium: 128 mmol/L — ABNORMAL LOW (ref 135–145)

## 2024-05-26 LAB — I-STAT ARTERIAL BLOOD GAS, ED
Acid-Base Excess: 12 mmol/L — ABNORMAL HIGH (ref 0.0–2.0)
Bicarbonate: 33.9 mmol/L — ABNORMAL HIGH (ref 20.0–28.0)
Calcium, Ion: 1.16 mmol/L (ref 1.15–1.40)
HCT: 39 % (ref 36.0–46.0)
Hemoglobin: 13.3 g/dL (ref 12.0–15.0)
O2 Saturation: 100 %
Patient temperature: 88.1
Potassium: 3.6 mmol/L (ref 3.5–5.1)
Sodium: 130 mmol/L — ABNORMAL LOW (ref 135–145)
TCO2: 35 mmol/L — ABNORMAL HIGH (ref 22–32)
pCO2 arterial: 26.9 mmHg — ABNORMAL LOW (ref 32–48)
pH, Arterial: 7.691 (ref 7.35–7.45)
pO2, Arterial: 457 mmHg — ABNORMAL HIGH (ref 83–108)

## 2024-05-26 LAB — HEPATIC FUNCTION PANEL
ALT: 31 U/L (ref 0–44)
AST: 25 U/L (ref 15–41)
Albumin: 3.7 g/dL (ref 3.5–5.0)
Alkaline Phosphatase: 85 U/L (ref 38–126)
Bilirubin, Direct: <0.1 mg/dL (ref 0.0–0.2)
Total Bilirubin: 0.4 mg/dL (ref 0.0–1.2)
Total Protein: 7.3 g/dL (ref 6.5–8.1)

## 2024-05-26 LAB — I-STAT CHEM 8, ED
BUN: 34 mg/dL — ABNORMAL HIGH (ref 8–23)
Calcium, Ion: 1.3 mmol/L (ref 1.15–1.40)
Chloride: 87 mmol/L — ABNORMAL LOW (ref 98–111)
Creatinine, Ser: 0.6 mg/dL (ref 0.44–1.00)
Glucose, Bld: 138 mg/dL — ABNORMAL HIGH (ref 70–99)
HCT: 47 % — ABNORMAL HIGH (ref 36.0–46.0)
Hemoglobin: 16 g/dL — ABNORMAL HIGH (ref 12.0–15.0)
Potassium: 4.3 mmol/L (ref 3.5–5.1)
Sodium: 130 mmol/L — ABNORMAL LOW (ref 135–145)
TCO2: 43 mmol/L — ABNORMAL HIGH (ref 22–32)

## 2024-05-26 LAB — URINALYSIS, W/ REFLEX TO CULTURE (INFECTION SUSPECTED)
Bilirubin Urine: NEGATIVE
Glucose, UA: 50 mg/dL — AB
Hgb urine dipstick: NEGATIVE
Ketones, ur: NEGATIVE mg/dL
Leukocytes,Ua: NEGATIVE
Nitrite: NEGATIVE
Protein, ur: 30 mg/dL — AB
Specific Gravity, Urine: 1.01 (ref 1.005–1.030)
pH: 7 (ref 5.0–8.0)

## 2024-05-26 LAB — RESP PANEL BY RT-PCR (RSV, FLU A&B, COVID)  RVPGX2
Influenza A by PCR: NEGATIVE
Influenza B by PCR: NEGATIVE
Resp Syncytial Virus by PCR: NEGATIVE
SARS Coronavirus 2 by RT PCR: NEGATIVE

## 2024-05-26 LAB — MRSA NEXT GEN BY PCR, NASAL: MRSA by PCR Next Gen: NOT DETECTED

## 2024-05-26 LAB — ECHOCARDIOGRAM COMPLETE
Area-P 1/2: 4.21 cm2
Height: 64 in
S' Lateral: 2.2 cm
Weight: 1372.14 [oz_av]

## 2024-05-26 LAB — CK: Total CK: 44 U/L (ref 38–234)

## 2024-05-26 LAB — I-STAT CG4 LACTIC ACID, ED: Lactic Acid, Venous: 0.9 mmol/L (ref 0.5–1.9)

## 2024-05-26 LAB — AMMONIA: Ammonia: 24 umol/L (ref 9–35)

## 2024-05-26 LAB — PROCALCITONIN: Procalcitonin: <0.1 ng/mL

## 2024-05-26 MED ORDER — MIDAZOLAM BOLUS VIA INFUSION: 0.0000 mg | INTRAVENOUS | Status: DC | PRN

## 2024-05-26 MED ORDER — ENOXAPARIN SODIUM 30 MG/0.3ML IJ SOSY
30.0000 mg | PREFILLED_SYRINGE | INTRAMUSCULAR | Status: DC
  Administered 2024-05-26 – 2024-05-29 (×4): 30 mg via SUBCUTANEOUS
  Filled 2024-05-26 (×4): qty 0.3

## 2024-05-26 MED ORDER — VANCOMYCIN HCL 750 MG/150ML IV SOLN
750.0000 mg | Freq: Once | INTRAVENOUS | Status: AC
  Administered 2024-05-26: 750 mg via INTRAVENOUS
  Filled 2024-05-26: qty 150

## 2024-05-26 MED ORDER — INSULIN ASPART 100 UNIT/ML IJ SOLN
0.0000 [IU] | INTRAMUSCULAR | Status: DC
  Administered 2024-05-26: 1 [IU] via SUBCUTANEOUS

## 2024-05-26 MED ORDER — NOREPINEPHRINE 4 MG/250ML-% IV SOLN
INTRAVENOUS | Status: AC
  Filled 2024-05-26: qty 250

## 2024-05-26 MED ORDER — ENOXAPARIN SODIUM 30 MG/0.3ML IJ SOSY: 30.0000 mg | PREFILLED_SYRINGE | INTRAMUSCULAR | Status: DC

## 2024-05-26 MED ORDER — SODIUM CHLORIDE 0.9 % IV BOLUS
1000.0000 mL | Freq: Once | INTRAVENOUS | Status: AC
  Administered 2024-05-26: 1000 mL via INTRAVENOUS

## 2024-05-26 MED ORDER — MIDAZOLAM HCL 2 MG/2ML IJ SOLN: 1.0000 mg | INTRAMUSCULAR | Status: DC | PRN

## 2024-05-26 MED ORDER — ENOXAPARIN SODIUM 40 MG/0.4ML IJ SOSY: 40.0000 mg | PREFILLED_SYRINGE | INTRAMUSCULAR | Status: DC

## 2024-05-26 MED ORDER — IPRATROPIUM-ALBUTEROL 0.5-2.5 (3) MG/3ML IN SOLN: 3.0000 mL | Freq: Four times a day (QID) | RESPIRATORY_TRACT | Status: DC | PRN

## 2024-05-26 MED ORDER — SODIUM CHLORIDE 0.9 % IV SOLN
2.0000 g | Freq: Two times a day (BID) | INTRAVENOUS | Status: DC
  Administered 2024-05-26 – 2024-05-28 (×4): 2 g via INTRAVENOUS
  Filled 2024-05-26 (×4): qty 12.5

## 2024-05-26 MED ORDER — ONDANSETRON HCL 4 MG/2ML IJ SOLN: 4.0000 mg | Freq: Four times a day (QID) | INTRAMUSCULAR | Status: DC | PRN

## 2024-05-26 MED ORDER — DEXTROSE 50 % IV SOLN
25.0000 g | INTRAVENOUS | Status: AC
  Administered 2024-05-26: 25 g via INTRAVENOUS
  Filled 2024-05-26: qty 50

## 2024-05-26 MED ORDER — DOCUSATE SODIUM 50 MG/5ML PO LIQD
100.0000 mg | Freq: Two times a day (BID) | ORAL | Status: DC
  Administered 2024-05-26 – 2024-05-30 (×8): 100 mg
  Filled 2024-05-26 (×8): qty 10

## 2024-05-26 MED ORDER — POLYETHYLENE GLYCOL 3350 17 G PO PACK
17.0000 g | PACK | Freq: Every day | ORAL | Status: DC | PRN
  Administered 2024-05-28: 17 g via ORAL
  Filled 2024-05-26: qty 1

## 2024-05-26 MED ORDER — VASOPRESSIN 20 UNITS/100 ML INFUSION FOR SHOCK
0.0300 [IU]/min | INTRAVENOUS | Status: DC
  Administered 2024-05-26 – 2024-05-27 (×3): 0.03 [IU]/min via INTRAVENOUS
  Filled 2024-05-26 (×3): qty 100

## 2024-05-26 MED ORDER — NOREPINEPHRINE 16 MG/250ML-% IV SOLN
0.0000 ug/min | INTRAVENOUS | Status: DC
  Administered 2024-05-26: 10 ug/min via INTRAVENOUS
  Filled 2024-05-26: qty 250

## 2024-05-26 MED ORDER — ORAL CARE MOUTH RINSE
15.0000 mL | OROMUCOSAL | Status: DC
  Administered 2024-05-26 – 2024-05-30 (×48): 15 mL via OROMUCOSAL

## 2024-05-26 MED ORDER — SODIUM CHLORIDE 0.9 % IV SOLN
1.0000 g | Freq: Once | INTRAVENOUS | Status: AC
  Administered 2024-05-26: 1 g via INTRAVENOUS
  Filled 2024-05-26: qty 10

## 2024-05-26 MED ORDER — NOREPINEPHRINE 4 MG/250ML-% IV SOLN: 0.0000 ug/min | INTRAVENOUS | Status: DC

## 2024-05-26 MED ORDER — ORAL CARE MOUTH RINSE: 15.0000 mL | OROMUCOSAL | Status: DC | PRN

## 2024-05-26 MED ORDER — LACTATED RINGERS IV BOLUS
1000.0000 mL | Freq: Once | INTRAVENOUS | Status: AC
  Administered 2024-05-26: 1000 mL via INTRAVENOUS

## 2024-05-26 MED ORDER — ROCURONIUM BROMIDE 10 MG/ML (PF) SYRINGE
PREFILLED_SYRINGE | INTRAVENOUS | Status: DC | PRN
  Administered 2024-05-26: 50 mg via INTRAVENOUS

## 2024-05-26 MED ORDER — MIDAZOLAM-SODIUM CHLORIDE 100-0.9 MG/100ML-% IV SOLN
0.0000 mg/h | INTRAVENOUS | Status: DC
  Administered 2024-05-26: 2 mg/h via INTRAVENOUS
  Filled 2024-05-26: qty 100

## 2024-05-26 MED ORDER — FAMOTIDINE 20 MG PO TABS
20.0000 mg | ORAL_TABLET | Freq: Every day | ORAL | Status: DC
  Administered 2024-05-26 – 2024-05-30 (×5): 20 mg
  Filled 2024-05-26 (×5): qty 1

## 2024-05-26 MED ORDER — FENTANYL BOLUS VIA INFUSION
25.0000 ug | INTRAVENOUS | Status: DC | PRN
  Administered 2024-05-27: 75 ug via INTRAVENOUS
  Administered 2024-05-27 (×2): 100 ug via INTRAVENOUS
  Administered 2024-05-27 (×2): 50 ug via INTRAVENOUS
  Administered 2024-05-27: 100 ug via INTRAVENOUS
  Administered 2024-05-27: 50 ug via INTRAVENOUS
  Administered 2024-05-27: 100 ug via INTRAVENOUS
  Administered 2024-05-27: 25 ug via INTRAVENOUS
  Administered 2024-05-28: 75 ug via INTRAVENOUS
  Administered 2024-05-28: 50 ug via INTRAVENOUS
  Administered 2024-05-28: 100 ug via INTRAVENOUS
  Administered 2024-05-28: 75 ug via INTRAVENOUS
  Administered 2024-05-28 (×2): 50 ug via INTRAVENOUS
  Administered 2024-05-29 (×2): 75 ug via INTRAVENOUS
  Administered 2024-05-30 (×3): 50 ug via INTRAVENOUS

## 2024-05-26 MED ORDER — VANCOMYCIN HCL 500 MG/100ML IV SOLN
500.0000 mg | INTRAVENOUS | Status: DC
  Administered 2024-05-27 – 2024-05-28 (×2): 500 mg via INTRAVENOUS
  Filled 2024-05-26 (×2): qty 100

## 2024-05-26 MED ORDER — VASOPRESSIN 20 UNITS/100 ML INFUSION FOR SHOCK
INTRAVENOUS | Status: AC
  Filled 2024-05-26: qty 100

## 2024-05-26 MED ORDER — SODIUM CHLORIDE 0.9% FLUSH
10.0000 mL | Freq: Two times a day (BID) | INTRAVENOUS | Status: DC
  Administered 2024-05-26 – 2024-05-27 (×2): 20 mL
  Administered 2024-05-27 – 2024-05-30 (×6): 10 mL

## 2024-05-26 MED ORDER — FENTANYL 2500MCG IN NS 250ML (10MCG/ML) PREMIX INFUSION
0.0000 ug/h | INTRAVENOUS | Status: DC
  Administered 2024-05-26: 50 ug/h via INTRAVENOUS
  Administered 2024-05-27: 150 ug/h via INTRAVENOUS
  Administered 2024-05-28: 175 ug/h via INTRAVENOUS
  Administered 2024-05-28: 200 ug/h via INTRAVENOUS
  Administered 2024-05-29: 125 ug/h via INTRAVENOUS
  Administered 2024-05-30: 175 ug/h via INTRAVENOUS
  Filled 2024-05-26 (×6): qty 250

## 2024-05-26 MED ORDER — CHLORHEXIDINE GLUCONATE CLOTH 2 % EX PADS
6.0000 | MEDICATED_PAD | Freq: Every day | CUTANEOUS | Status: DC
  Administered 2024-05-26 – 2024-05-30 (×5): 6 via TOPICAL

## 2024-05-26 MED ORDER — FENTANYL CITRATE PF 50 MCG/ML IJ SOSY
25.0000 ug | PREFILLED_SYRINGE | Freq: Once | INTRAMUSCULAR | Status: AC
  Administered 2024-05-26: 50 ug via INTRAVENOUS

## 2024-05-26 MED ORDER — DOCUSATE SODIUM 100 MG PO CAPS: 100.0000 mg | ORAL_CAPSULE | Freq: Two times a day (BID) | ORAL | Status: DC | PRN

## 2024-05-26 MED ORDER — NOREPINEPHRINE 4 MG/250ML-% IV SOLN
0.0000 ug/min | INTRAVENOUS | Status: DC
  Administered 2024-05-26: 30 ug/min via INTRAVENOUS

## 2024-05-26 MED ORDER — SODIUM CHLORIDE 0.9% FLUSH: 10.0000 mL | INTRAVENOUS | Status: DC | PRN

## 2024-05-26 MED ORDER — POLYETHYLENE GLYCOL 3350 17 G PO PACK
17.0000 g | PACK | Freq: Every day | ORAL | Status: DC
  Administered 2024-05-26 – 2024-05-30 (×5): 17 g
  Filled 2024-05-26 (×5): qty 1

## 2024-05-26 MED ORDER — ETOMIDATE 2 MG/ML IV SOLN
INTRAVENOUS | Status: DC | PRN
  Administered 2024-05-26: 15 mg via INTRAVENOUS

## 2024-05-26 NOTE — Sepsis Progress Note (Signed)
 Sepsis protocol monitored by eLink ?

## 2024-05-26 NOTE — Progress Notes (Signed)
 PICC consult: Attempted to reach pt's emergency contact to obtain consent. No answer.

## 2024-05-26 NOTE — Progress Notes (Signed)
 Patient was transported on the ventilator from the ED to 2M09 with no problems.

## 2024-05-26 NOTE — Progress Notes (Signed)
 eLink Physician-Brief Progress Note Patient Name: Dominique Brewer DOB: July 09, 1946 MRN: [id number]   Date of Service  05/26/2024  HPI/Events of Note  Severe hypoglycemia with multiple episodes.  No evidence of diabetes in the past.  CBGs controlled without insulin, goal is 180  eICU Interventions  Discontinue sliding scale insulin     Intervention Category Intermediate Interventions: Hyperglycemia - evaluation and treatment  Dominique Brewer 05/26/2024, 9:16 PM

## 2024-05-26 NOTE — Progress Notes (Signed)
 Ongoing hypotension, shock.  Vasopressin added.  2 L LR ordered.  Continue broad-spectrum antibiotics.  Notably procalcitonin is negative.  Still suspect hypovolemia as primary driver.  TTE reviewed, normal EF, normal RV size and function, valves look okay.  No concerning findings.

## 2024-05-26 NOTE — Progress Notes (Addendum)
 Pharmacy Antibiotic Note  Dominique Brewer is a 78 y.o. female admitted on 05/26/2024 with sepsis.  Pharmacy has been consulted for cefepime and vancomycin dosing. No notable micro hx found, but patient is from [facility]. Hx of ALS. Cr: 0.33, noted low body mass.   Plan: Cefepime 2g q12h.  Vancomycin 500mg  q24h (13mg /kg) for eAUC: 526. Vd: 0.72, Scr rounded to 0.8. TBW given < IBW.  Follow culture data for de-escalation.  Monitor renal function for dose adjustments as indicated.   F/u MRSA PCR.   Height: 5\' 4"  (162.6 cm) Weight: 38.9 kg (85 lb 12.1 oz) IBW/kg (Calculated) : 54.7  Temp (24hrs), Avg:90.9 F (32.7 C), Min:88.4 F (31.3 C), Max:96.1 F (35.6 C)  Recent Labs  Lab 05/26/24 0953 05/26/24 0954 05/26/24 0959  WBC  --   --  5.8  CREATININE 0.60  --  0.33*  LATICACIDVEN  --  0.9  --     Estimated Creatinine Clearance: 35.6 mL/min (A) (by C-G formula based on SCr of 0.33 mg/dL (L)).    Allergies  Allergen Reactions   Iodine Anaphylaxis   Shellfish Allergy Anaphylaxis    Due to iodine content   Zymaxid [Gatifloxacin] Other (See Comments)    Weakness to legs causing frequent falls; inability to stand or walk   Amlodipine Swelling    Leg and ankle swelling    Aleve [Naproxen] Swelling   Penicillins Other (See Comments)    Fever and rash Tolerated cephalosporins    Adoxa [Doxycycline] Other (See Comments)    Dizziness    Ak-Mycin [Erythromycin] Nausea Only   Benadryl Itch Stopping [Diphenhydramine-Zinc Acetate] Rash    Blistering rash Reaction to topical diphenhydramine    Codeine Other (See Comments)    Unknown reaction    Antimicrobials this admission: Vancomycin 6/2 >>  Cefepime 6/2 >>   Microbiology results: 6/2 BCx:  6/2 Sputum:   6/2 MRSA PCR:   Thank you for allowing pharmacy to be a part of this patient's care.  Mamie Searles, PharmD, BCCCP  05/26/2024 1:32 PM

## 2024-05-26 NOTE — Progress Notes (Signed)
 PICC medically necessary for IV pressors, family unreachable.

## 2024-05-26 NOTE — Progress Notes (Signed)
 Patient transported to CT & back to ED Trauma room C on the ventilator with no problems.

## 2024-05-26 NOTE — Progress Notes (Signed)
 Attempted to call emergency contact [name] listed on chart however no answer

## 2024-05-26 NOTE — H&P (Addendum)
 NAME:  Dominique Brewer, MRN:  [id number], DOB:  1946-11-21, LOS: 0 ADMISSION DATE:  05/26/2024, CONSULTATION DATE:  05/26/24 REFERRING MD:  EDP CHIEF COMPLAINT:  AHRF, encephalopathy   History of Present Illness:  Patient is a 78 year old female with pertinent medical hx of ALS with complication of acute hypercarbic respiratory failure requiring tracheostomy, HTN, HLD. She had tracheostomy removed on Friday and became altered since Saturday 05/31.  She was brought to ED via EMS from [facility] and found to be hypothermic at 89.2 F, hypertensive and encephalopathic. Per RN, she was not following any commands. Her respiratory status was deteriorating and she was intubated by the EDP. Given hypothermia, sepsis protocol was initiated and she was started on Cefepime, Vancomycin.   I spoke with nursing staff and [facility] and they stated pt was decannulated on Friday and when her nurse found her this am, she was not responding and had agonal breathing. She would withdraw to pain per their report. She was also hypotensive at 80s/40s which improved after IVF. They reported at baseline pt was able to communicate and use walker to move.   After further discussion with the nursing staff at [facility].  They reported patient tolerated trach capping for the 2 days prior to decannulation and was refusing vent support at night which was the recommendation.  Given these findings, decision was made to decannulate her.  Lab work showed normal CBC, BMP with some electrolyte disturbances but normal creatinine. Normal hepatic fxn, normal ammonia.   Imaging was notable for normal CXR and normal CTH.   Pertinent  Medical History  ALS HTN HLD Atrial Tachycardia  Significant Hospital Events: Including procedures, antibiotic start and stop dates in addition to other pertinent events   06/02: presented to the ED with encephalopathy, respiratory failure. Intubated and transferred to the ICU  Interim History / Subjective:  On  my exam, pt is intubated and sedated. She is not following any commands.   Objective    Blood pressure 137/65, pulse 86, temperature (!) 89.7 F (32.1 C), resp. rate 18, height 5\' 4"  (1.626 m), weight 38.9 kg, SpO2 100%.    Vent Mode: PRVC FiO2 (%):  [40 %-100 %] 40 % Set Rate:  [18 bmp-28 bmp] 18 bmp Vt Set:  [440 mL] 440 mL PEEP:  [5 cmH20] 5 cmH20 Plateau Pressure:  [17 cmH20] 17 cmH20   Intake/Output Summary (Last 24 hours) at 05/26/2024 1145 Last data filed at 05/26/2024 1033 Gross per 24 hour  Intake 23.91 ml  Output --  Net 23.91 ml   Filed Weights   05/26/24 0945 05/26/24 1142  Weight: 38.1 kg 38.9 kg    Examination: General: intubated and sedated HENT: intubated Lungs: CTAB, good air movement Cardiovascular: normal heart sounds, slightly bradycardic, well perfused Abdomen: soft abdomen Extremities: no asymmetry Neuro: sedated, unable to assess GU: no foley  Labs   CBC: Recent Labs  Lab 05/26/24 0953 05/26/24 0959 05/26/24 1054  WBC  --  5.8  --   NEUTROABS  --  5.3  --   HGB 16.3*  16.0* 14.3 13.3  HCT 48.0*  47.0* 44.5 39.0  MCV  --  92.5  --   PLT  --  261  --    Basic Metabolic Panel: Recent Labs  Lab 05/26/24 0953 05/26/24 0959 05/26/24 1054  NA 129*  130* 128* 130*  K 4.2  4.3 3.9 3.6  CL 87* 84*  --   CO2  --  36*  --  GLUCOSE 138* 134*  --   BUN 34* 24*  --   CREATININE 0.60 0.33*  --   CALCIUM  --  9.8  --    GFR: Estimated Creatinine Clearance: 35.6 mL/min (A) (by C-G formula based on SCr of 0.33 mg/dL (L)). Recent Labs  Lab 05/26/24 0954 05/26/24 0959  WBC  --  5.8  LATICACIDVEN 0.9  --    Liver Function Tests: Recent Labs  Lab 05/26/24 0959  AST 25  ALT 31  ALKPHOS 85  BILITOT 0.4  PROT 7.3  ALBUMIN 3.7   No results for input(s): "LIPASE", "AMYLASE" in the last 168 hours. Recent Labs  Lab 05/26/24 0959  AMMONIA 24   ABG    Component Value Date/Time   PHART 7.691 (HH) 05/26/2024 1054   PCO2ART 26.9 (L)  05/26/2024 1054   PO2ART 457 (H) 05/26/2024 1054   HCO3 33.9 (H) 05/26/2024 1054   TCO2 35 (H) 05/26/2024 1054   O2SAT 100 05/26/2024 1054    Coagulation Profile: No results for input(s): "INR", "PROTIME" in the last 168 hours. Cardiac Enzymes: No results for input(s): "CKTOTAL", "CKMB", "CKMBINDEX", "TROPONINI" in the last 168 hours. HbA1C: No results found for: "HGBA1C" CBG: No results for input(s): "GLUCAP" in the last 168 hours.  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  She was found unresponsive with agonal breathing by the nursing staff.  She was also found to be hypotensive.  Suspect underlying etiology is a respiratory failure for her mental status and hypotension.  Currently she is intubated on mechanical ventilation.  Plan to continue supportive care and treat empirically with antibiotics given her hypothermia (see below).  Patient with some postintubation hypotension likely related to sedation will started on Levophed.  Will wean as able with a goal of MAP greater than 65. - Continue vent support.   -wean as able, may be difficult given her ALS.    -PAD protocol, RAS goal 0  -VAP bundle  Hypothermia: Exact etiology unknown at this time.  Could be secondary to hypotension from respiratory failure.  Although her lactic acid was normal on arrival. Exact timing of events is patient deteriorated after decannulation.  She was found hypotensive which responded to IV fluids.  CXR negative. UA without sign of infection.  CBC with a leukocytosis.  Blood cultures obtained, patient on antibiotics including cefepime and vancomycin.  Will continue this respiratory cultures and panel ordered. - Continue IV antibiotics - Follow cultures - Continue Bair hugger given her  hypothermia. Monitor temperature.   ALS Severe protein calorie malnutrition Failure to thrive Pt with worsening functional status over the last several months.  At baseline she was eating and moving with a walker per report but was still dependent on tube feeds to maintain her nutrition.  Will monitor for now, and get dietitian on board after goals of care with family. - Continue goals of care conversation with the family - Dietitian consult for resumption of tube feeds until goals of care conversations are finalized  HTN/atrial flutter Hold home cardizem for now.  Best Practice (right click and "Reselect all SmartList Selections" daily)  Diet/type: NPO DVT prophylaxis: LMWH GI prophylaxis: PPI Lines: N/A Foley:  N/A Continuous: Fentanyl, versed  Code Status:  full code Last date of multidisciplinary goals of care discussion [  06/02 with Dominique Brewer]  Home Medications  Prior to Admission medications   Medication Sig Start Date End Date Taking? Authorizing Provider  CARDIZEM LA 120 MG 24 hr tablet  04/26/18   [provider]  latanoprost (XALATAN) 0.005 % ophthalmic solution Place 1 drop into both eyes at bedtime.    [provider]  Olmesartan-Amlodipine-HCTZ (TRIBENZOR) 20-5-12.5 MG TABS Take by mouth daily.    [provider]    Allergies Allergies  Allergen Reactions   Gatifloxacin Other (See Comments)    WEAKNESS TO LEGS-FALLS, INABILITY TO STAND OR WALK. Falling constantly into the street and walls WEAKNESS TO LEGS-FALLS, INABILITY TO STAND OR WALK. WEAKNESS TO LEGS-FALLS, INABILITY TO STAND OR WALK. WEAKNESS TO LEGS-FALLS, INABILITY TO STAND OR WALK. Falling constantly into the street and walls    Other Anaphylaxis and Other (See Comments)    Generic Meds- Not documented in historical UNABLE TO TAKE ANY GENERIC MEDICATIONS. UNABLE TO TAKE ANY GENERIC MEDICATIONS. UNABLE TO TAKE ANY GENERIC MEDICATIONS. Generic Meds- Not documented in  historical UNABLE TO TAKE ANY GENERIC MEDICATIONS. AVOIDS IODINE PRODUCTS Generic Meds- Not documented in historical    Shellfish Allergy Anaphylaxis    AVOIDS IODINE PRODUCTS   Amlodipine Swelling    SIDE EFFECT: LEG AND ANKLE SWELLING SIDE EFFECT: LEG AND ANKLE SWELLING SIDE EFFECT: LEG AND ANKLE SWELLING SIDE EFFECT: LEG AND ANKLE SWELLING    Diphenhydramine Other (See Comments) and Rash    TOPICAL=BURNED SKIN TOPICAL=BURNED SKIN TOPICAL=BURNED SKIN TOPICAL=BURNED SKIN    Aleve [Naproxen] Swelling   Penicillins Other (See Comments)    Fever and rash Tolerated cephalosporins    Codeine Other (See Comments)    Not documented in historical Not documented in historical Not documented in historical    Diphenhydramine-Zinc Acetate Other (See Comments)    blisters blisters    Doxycycline Other (See Comments)    Dizzy Dizzy Dizzy    Erythromycin Nausea Only   Erythromycin Base Nausea And Vomiting and Other (See Comments)    Jackolyn Masker, MD [facility]. [facility], PGY-3

## 2024-05-26 NOTE — TOC Initial Note (Signed)
 Transition of Care [facility]) - Initial/Assessment Note    Patient Details  Name: Dominique Brewer MRN: [id number] Date of Birth: [birth year]  [facility]) CM/SW Contact:    Juliane Och, LCSW Phone Number: 05/26/2024, 12:43 PM  Clinical Narrative:                  12:43 PM Per chart review, patient is from [facility]. SNF admissions confirmed patient was STR at SNF and is able to return when medically ready. SNF informed CSW that patient admitted to them 04/29/2024.  Expected Discharge Plan: Skilled Nursing Facility Barriers to Discharge: Continued Medical Work up   Patient Goals and CMS Choice            Expected Discharge Plan and Services In-house Referral: Clinical Social Work   Post Acute Care Choice: Skilled Nursing Facility Living arrangements for the past 2 months: Skilled Nursing Facility                                      Prior Living Arrangements/Services Living arrangements for the past 2 months: Skilled Nursing Facility Lives with:: Facility Resident Patient language and need for interpreter reviewed:: Yes              Criminal Activity/Legal Involvement Pertinent to Current Situation/Hospitalization: No - Comment as needed  Activities of Daily Living      Permission Sought/Granted Permission sought to share information with : Family Supports, Oceanographer granted to share information with : No (Contact information on chart)  Share Information with NAME: [name]  Permission granted to share info w AGENCY: [facility]     Permission granted to share info w Contact Information: [phone]  Emotional Assessment         Alcohol / Substance Use: Not Applicable Psych Involvement: No (comment)  Admission diagnosis:  Respiratory failure (HCC) [J96.90] Patient Active Problem List   Diagnosis Date Noted   Respiratory failure (HCC) 05/26/2024   Foot pain 10/10/2013   Neuralgia of right foot  10/10/2013   PCP:  Wayne Haines, MD Pharmacy:   [facility] [address], [place] - [address] [place] [address] Phone: [phone] Fax: [phone]     Social Drivers of Health (SDOH) Social History: SDOH Screenings   Food Insecurity: Low Risk  (03/07/2024)   Received from [facility]  Housing: Low Risk  (03/07/2024)   Received from [facility]  Transportation Needs: No Transportation Needs (03/07/2024)   Received from [facility]  Utilities: Low Risk  (03/07/2024)   Received from [facility]  Financial Resource Strain: High Risk (06/08/2023)   Received from [facility]  Physical Activity: Unknown (12/08/2022)   Received from [facility]  Social Connections: Socially Isolated (12/08/2022)   Received from [facility]  Stress: No Stress Concern Present (12/08/2022)   Received from [facility]  Tobacco Use: Low Risk  (05/26/2024)   SDOH Interventions:     Readmission Risk Interventions     No data to display

## 2024-05-26 NOTE — ED Notes (Signed)
 Warm fluids started. Bair hugger placed on patient.

## 2024-05-26 NOTE — ED Provider Notes (Signed)
 [facility] AT [facility] Provider Note   CSN: [id number] Arrival date & time: 05/26/24  0932     History  Chief Complaint  Patient presents with   Altered Mental Status    Dominique Brewer is a 78 y.o. female.  The history is provided by the EMS personnel and medical records. The history is limited by the condition of the patient.  Altered Mental Status Presenting symptoms: unresponsiveness   Severity:  Severe Most recent episode:  Today Episode history:  Continuous Timing:  Unable to specify Progression:  Unable to specify Context: nursing home resident   Associated symptoms: no agitation        Home Medications Prior to Admission medications   Medication Sig Start Date End Date Taking? Authorizing Provider  CARDIZEM LA 120 MG 24 hr tablet  04/26/18   [provider]  latanoprost (XALATAN) 0.005 % ophthalmic solution Place 1 drop into both eyes at bedtime.    [provider]  Olmesartan-Amlodipine-HCTZ (TRIBENZOR) 20-5-12.5 MG TABS Take by mouth daily.    [provider]      Allergies    Gatifloxacin, Other, Shellfish allergy, Amlodipine, Diphenhydramine, Aleve [naproxen], Penicillins, Codeine, Diphenhydramine-zinc acetate, Doxycycline, Erythromycin, and Erythromycin base    Review of Systems   Review of Systems  Unable to perform ROS: Intubated  Psychiatric/Behavioral:  Negative for agitation.     Physical Exam Updated Vital Signs BP (!) 176/88   Pulse 86   Temp (S) (!) 89.2 F (31.8 C)   Resp (!) 28   Ht 5\' 4"  (1.626 m)   Wt 38.1 kg   SpO2 100%   BMI 14.42 kg/m  Physical Exam Vitals and nursing note reviewed.  Constitutional:      General: She is in acute distress.     Appearance: She is well-developed. She is ill-appearing.  HENT:     Head: Normocephalic and atraumatic.  Eyes:     Conjunctiva/sclera: Conjunctivae normal.  Cardiovascular:     Rate and Rhythm: Normal rate and regular rhythm.      Heart sounds: No murmur heard. Pulmonary:     Effort: Respiratory distress present.     Breath sounds: Rhonchi present. No wheezing or rales.  Chest:     Chest wall: No tenderness.  Abdominal:     Palpations: Abdomen is soft.     Tenderness: There is no abdominal tenderness.  Musculoskeletal:        General: No swelling or tenderness.     Cervical back: Neck supple. No tenderness.     Right lower leg: No edema.     Left lower leg: No edema.  Skin:    General: Skin is warm and dry.     Capillary Refill: Capillary refill takes less than 2 seconds.     Findings: No erythema or rash.  Psychiatric:        Mood and Affect: Mood normal.     ED Results / Procedures / Treatments   Labs (all labs ordered are listed, but only abnormal results are displayed) Labs Reviewed  CBC WITH DIFFERENTIAL/PLATELET - Abnormal; Notable for the following components:      Result Value   Lymphs Abs 0.3 (*)    All other components within normal limits  I-STAT CHEM 8, ED - Abnormal; Notable for the following components:   Sodium 130 (*)    Chloride 87 (*)    BUN 34 (*)    Glucose, Bld 138 (*)  TCO2 43 (*)    Hemoglobin 16.0 (*)    HCT 47.0 (*)    All other components within normal limits  I-STAT VENOUS BLOOD GAS, ED - Abnormal; Notable for the following components:   pH, Ven 7.219 (*)    pCO2, Ven 104.1 (*)    pO2, Ven 82 (*)    Bicarbonate 42.5 (*)    TCO2 46 (*)    Acid-Base Excess 9.0 (*)    Sodium 129 (*)    HCT 48.0 (*)    Hemoglobin 16.3 (*)    All other components within normal limits  CULTURE, BLOOD (ROUTINE X 2)  CULTURE, BLOOD (ROUTINE X 2)  RESP PANEL BY RT-PCR (RSV, FLU A&B, COVID)  RVPGX2  MRSA NEXT GEN BY PCR, NASAL  CULTURE, RESPIRATORY W GRAM STAIN  RESPIRATORY PANEL BY PCR  AMMONIA  BASIC METABOLIC PANEL WITH GFR  HEPATIC FUNCTION PANEL  URINALYSIS, W/ REFLEX TO CULTURE (INFECTION SUSPECTED)  CBC  CREATININE, SERUM  I-STAT CG4 LACTIC ACID, ED  I-STAT ARTERIAL  BLOOD GAS, ED    EKG EKG Interpretation Date/Time:  Monday May 26 2024 09:34:36 EDT Ventricular Rate:  62 PR Interval:  135 QRS Duration:  102 QT Interval:  452 QTC Calculation: 459 R Axis:   -52  Text Interpretation: Sinus or ectopic atrial rhythm Left atrial enlargement Left anterior fascicular block Left ventricular hypertrophy ST elevation, consider anterior injury no prior ECG for comparison No STEMI Confirmed by Wynell Heath ([id number]) on 05/26/2024 9:47:46 AM  Radiology ECHOCARDIOGRAM COMPLETE Result Date: 05/26/2024    ECHOCARDIOGRAM REPORT   Patient Name:   Dominique Brewer Date of Exam: 05/26/2024 Medical Rec #:  [id number]      Height:       64.0 in Accession #:    [id number]     Weight:       85.8 lb Date of Birth:  1946/06/05      BSA:          1.365 m Patient Age:    78 years       BP:           96/49 mmHg Patient Gender: F              HR:           96 bpm. Exam Location:  Inpatient Procedure: 2D Echo, Cardiac Doppler and Color Doppler (Both Spectral and Color            Flow Doppler were utilized during procedure). Indications:    Shock  History:        Patient has no prior history of Echocardiogram examinations.  Sonographer:    Janette Medley Referring Phys: Guerry Leek IMPRESSIONS  1. Left ventricular ejection fraction, by estimation, is 70 to 75%. The left ventricle has hyperdynamic function. The left ventricle has no regional wall motion abnormalities. There is mild concentric left ventricular hypertrophy. Left ventricular diastolic parameters are indeterminate.  2. Right ventricular systolic function is normal. The right ventricular size is normal.  3. The mitral valve is grossly normal. No evidence of mitral valve regurgitation. No evidence of mitral stenosis.  4. The aortic valve is grossly normal. Aortic valve regurgitation is not visualized. No aortic stenosis is present.  5. The inferior vena cava is normal in size with <50% respiratory variability, suggesting right atrial  pressure of 8 mmHg. FINDINGS  Left Ventricle: Left ventricular ejection fraction, by estimation, is 70 to 75%. The left ventricle has hyperdynamic function.  The left ventricle has no regional wall motion abnormalities. The left ventricular internal cavity size was normal in size. There is mild concentric left ventricular hypertrophy. Left ventricular diastolic parameters are indeterminate. Right Ventricle: The right ventricular size is normal. No increase in right ventricular wall thickness. Right ventricular systolic function is normal. Left Atrium: Left atrial size was normal in size. Right Atrium: Right atrial size was normal in size. Pericardium: There is no evidence of pericardial effusion. Mitral Valve: The mitral valve is grossly normal. No evidence of mitral valve regurgitation. No evidence of mitral valve stenosis. Tricuspid Valve: The tricuspid valve is grossly normal. Tricuspid valve regurgitation is not demonstrated. No evidence of tricuspid stenosis. Aortic Valve: The aortic valve is grossly normal. Aortic valve regurgitation is not visualized. No aortic stenosis is present. Pulmonic Valve: The pulmonic valve was normal in structure. Pulmonic valve regurgitation is not visualized. No evidence of pulmonic stenosis. Aorta: The aortic root is normal in size and structure. Venous: The inferior vena cava is normal in size with less than 50% respiratory variability, suggesting right atrial pressure of 8 mmHg. IAS/Shunts: No atrial level shunt detected by color flow Doppler.  LEFT VENTRICLE PLAX 2D LVIDd:         3.90 cm   Diastology LVIDs:         2.20 cm   LV e' medial:    6.53 cm/s LV PW:         0.50 cm   LV E/e' medial:  8.7 LV IVS:        1.20 cm   LV e' lateral:   7.94 cm/s LVOT diam:     1.70 cm   LV E/e' lateral: 7.1 LV SV:         38 LV SV Index:   28 LVOT Area:     2.27 cm  RIGHT VENTRICLE             IVC RV S prime:     10.60 cm/s  IVC diam: 1.80 cm TAPSE (M-mode): 1.6 cm LEFT ATRIUM              Index        RIGHT ATRIUM          Index LA diam:        2.20 cm 1.61 cm/m   RA Area:     5.92 cm LA Vol (A2C):   15.5 ml 11.35 ml/m  RA Volume:   9.50 ml  6.96 ml/m LA Vol (A4C):   15.7 ml 11.50 ml/m LA Biplane Vol: 16.7 ml 12.23 ml/m  AORTIC VALVE LVOT Vmax:   105.00 cm/s LVOT Vmean:  69.700 cm/s LVOT VTI:    0.166 m  AORTA Ao Root diam: 2.50 cm MITRAL VALVE MV Area (PHT): 4.21 cm    SHUNTS MV Decel Time: 180 msec    Systemic VTI:  0.17 m MV E velocity: 56.60 cm/s  Systemic Diam: 1.70 cm MV A velocity: 93.30 cm/s MV E/A ratio:  0.61 Aditya Sabharwal Electronically signed by Alwin Baars Signature Date/Time: 05/26/2024/3:34:19 PM    Final    US  EKG SITE RITE Result Date: 05/26/2024 If Site Rite image not attached, placement could not be confirmed due to current cardiac rhythm.  CT Head Wo Contrast Result Date: 05/26/2024 CLINICAL DATA:  78 year old female with altered mental status. Respiratory distress. EXAM: CT HEAD WITHOUT CONTRAST TECHNIQUE: Contiguous axial images were obtained from the base of the skull through the vertex without intravenous contrast. RADIATION  DOSE REDUCTION: This exam was performed according to the departmental dose-optimization program which includes automated exposure control, adjustment of the mA and/or kV according to patient size and/or use of iterative reconstruction technique. COMPARISON:  None Available. FINDINGS: Brain: Cerebral volume is within normal limits for age. No midline shift, ventriculomegaly, mass effect, evidence of mass lesion, intracranial hemorrhage or evidence of cortically based acute infarction. Gray-white matter differentiation is within normal limits throughout the brain. Vascular: Calcified atherosclerosis at the skull base. No suspicious intracranial vascular hyperdensity. Skull: Intact.  No acute osseous abnormality identified. Sinuses/Orbits: Visualized paranasal sinuses and mastoids are well aerated. Other: Fluid throughout the visible pharynx,  patient intubated on the scout view. Visualized orbits and scalp soft tissues are within normal limits. IMPRESSION: 1. Normal for age noncontrast CT appearance of the brain. 2. Intubated. Electronically Signed   By: Marlise Simpers M.D.   On: 05/26/2024 10:42   DG Chest Portable 1 View Result Date: 05/26/2024 CLINICAL DATA:  Respiratory distress EXAM: PORTABLE CHEST 1 VIEW COMPARISON:  None Available. FINDINGS: Tip of the endotracheal tube 1.5 cm from carina The heart size and mediastinal contours are within normal limits. Both lungs are clear. The visualized skeletal structures are unremarkable. IMPRESSION: No active disease. Electronically Signed   By: Fredrich Jefferson M.D.   On: 05/26/2024 10:30    Procedures Procedure Name: Intubation Date/Time: 05/26/2024 10:51 AM  Performed by: Craige Dixon, MDPre-anesthesia Checklist: Emergency Drugs available, Patient identified, Patient being monitored and Timeout performed Oxygen Delivery Method: Non-rebreather mask Preoxygenation: Pre-oxygenation with 100% oxygen Induction Type: Rapid sequence Laryngoscope Size: Glidescope Grade View: Grade I Tube size: 7.5 mm Number of attempts: 1 Placement Confirmation: ETT inserted through vocal cords under direct vision, Positive ETCO2, Breath sounds checked- equal and bilateral and CO2 detector Secured at: 23 cm Tube secured with: ETT holder Dental Injury: Teeth and Oropharynx as per pre-operative assessment          CRITICAL CARE Performed by: Marine Sia Cortez Flippen Total critical care time: 40 minutes Critical care time was exclusive of separately billable procedures and treating other patients. Critical care was necessary to treat or prevent imminent or life-threatening deterioration. Critical care was time spent personally by me on the following activities: development of treatment plan with patient and/or surrogate as well as nursing, discussions with consultants, evaluation of patient's response to  treatment, examination of patient, obtaining history from patient or surrogate, ordering and performing treatments and interventions, ordering and review of laboratory studies, ordering and review of radiographic studies, pulse oximetry and re-evaluation of patient's condition.  Medications Ordered in ED Medications  docusate (COLACE) 50 MG/5ML liquid 100 mg (100 mg Per Tube Given 05/26/24 1323)  polyethylene glycol (MIRALAX / GLYCOLAX) packet 17 g (17 g Per Tube Given 05/26/24 1324)  fentaNYL in NS 250mL (10mcg/ml) infusion-PREMIX (50 mcg/hr Intravenous Infusion Verify 05/26/24 1600)  fentaNYL (SUBLIMAZE) bolus via infusion 25-100 mcg (has no administration in time range)  midazolam (VERSED) 100 mg/100 mL (1 mg/mL) premix infusion (2 mg/hr Intravenous Infusion Verify 05/26/24 1600)  midazolam (VERSED) bolus via infusion 0-5 mg (has no administration in time range)  norepinephrine (LEVOPHED) 4-5 MG/250ML-% infusion SOLN (0 mcg/kg/min  Stopped 05/26/24 1418)  norepinephrine (LEVOPHED) 4mg  in (0.016 mg/mL) premix infusion (9 mcg/min Intravenous Infusion Verify 05/26/24 1600)  docusate sodium (COLACE) capsule 100 mg (has no administration in time range)  polyethylene glycol (MIRALAX / GLYCOLAX) packet 17 g (has no administration in time range)  famotidine (PEPCID) tablet 20  mg (20 mg Per Tube Given 05/26/24 1323)  ondansetron (ZOFRAN) injection 4 mg (has no administration in time range)  ipratropium-albuterol (DUONEB) 0.5-2.5 (3) MG/3ML nebulizer solution 3 mL (has no administration in time range)  Chlorhexidine Gluconate Cloth 2 % PADS 6 each (6 each Topical Given 05/26/24 1149)  Oral care mouth rinse (15 mLs Mouth Rinse Given 05/26/24 1529)  Oral care mouth rinse (has no administration in time range)  insulin aspart (novoLOG) injection 0-9 Units (1 Units Subcutaneous Given 05/26/24 1528)  enoxaparin (LOVENOX) injection 30 mg (has no administration in time range)  ceFEPIme (MAXIPIME) 2 g in sodium  chloride 0.9 % 100 mL IVPB (has no administration in time range)  vancomycin (VANCOREADY) IVPB 500 mg/100 mL (has no administration in time range)  sodium chloride 0.9 % bolus 1,000 mL (0 mLs Intravenous Stopped 05/26/24 1033)  fentaNYL (SUBLIMAZE) injection 25-50 mcg (50 mcg Intravenous Given 05/26/24 1002)  sodium chloride 0.9 % bolus 1,000 mL (1,000 mLs Intravenous New Bag/Given 05/26/24 1038)  vancomycin (VANCOREADY) IVPB 750 mg/150 mL (0 mg Intravenous Paused 05/26/24 1139)  ceFEPIme (MAXIPIME) 1 g in sodium chloride 0.9 % 100 mL IVPB (0 g Intravenous Stopped 05/26/24 1124)  lactated ringers bolus 1,000 mL ( Intravenous Stopped 05/26/24 1452)    ED Course/ Medical Decision Making/ A&P                                 Medical Decision Making Amount and/or Complexity of Data Reviewed Labs: ordered. Radiology: ordered.  Risk Prescription drug management. Decision regarding hospitalization.    Martasia Talamante is a 78 y.o. female with a past medical history significant for ALS with previous trach who presents for altered mental status and respiratory failure.  According to EMS, patient's coming from [facility] nursing facility where she had altered mental status and difficulty breathing.  According to EMS, she had her tracheostomy decannulated on Friday and it has since closed.  She reportedly was last seen normal on Saturday and has been abnormal with her mental status since then.  Per EMS, patient was found to be hypoxic, abnormal respirations, and was altered.  Patient minimally responding to painful stimuli and required a nonrebreather to get her oxygen saturations into the 80s and 90s initially.  Patient did have a gag response so they did not intubate initially.  Patient was cool to the touch but was not tachycardic.  She had gasping breathing.  Patient's blood pressure was elevated initially.  There is no reported trauma.  No reported head injury.  On arrival, patient is not protecting her airway  well.  Is not responding to painful stimuli and is not opening her eyes.  She is not speaking.  GCS is quite low no concern she is aspirating.  Patient did not have focal tenderness on my exam and she has a feeding tube in her abdomen.  Decision made to intubate for airway protection.  I confirmed with EMS that she was full code and did not see a DNR in her chart.  Patient intubated without difficulty.  After intubation her blood pressure did drop and she was started on pressors.  She was getting fluids.  Rectal temperature was found to be 89 and sepsis was activated.  Patient was already getting fluids.  With the cold temperature, hypotension, tachypnea, and coarse breath sounds she will be treated for possible infection.  Critical care was called and will see for admission.  Patient CO2 found to be 104 so hypercarbic respiratory failure also may be contribute to altered mental status.  Hepatic function is normal.  CBC reassuring.  Metabolic panel showed some abnormalities with hyponatremia and hypochloremia.  Creatinine not elevated.  Anion gap is normal.  CT head did not show acute abnormality and chest x-ray did not show pneumonia.  Patient will be admitted for further management of hypoxic and hypercarbic respiratory failure.          Final Clinical Impression(s) / ED Diagnoses Final diagnoses:  Acute respiratory failure with hypoxia and hypercapnia (HCC)     Clinical Impression: 1. Acute respiratory failure with hypoxia and hypercapnia (HCC)     Disposition: Admit  This note was prepared with assistance of Dragon voice recognition software. Occasional wrong-word or sound-a-like substitutions may have occurred due to the inherent limitations of voice recognition software.      Raffaella Edison, Marine Sia, MD 05/26/24 [phone]

## 2024-05-26 NOTE — ED Notes (Signed)
Bear hugger placed onto patient

## 2024-05-26 NOTE — ED Triage Notes (Signed)
 Per [facility] pt coming from skilled nursing side of [facility] Saturday afternoon. Since then has decompensated with ams and respiratory. States about 30 minutes ago was unresponsive and agonal breathing. Ems reports she had a gag response. Trach removed Friday of last week.

## 2024-05-26 NOTE — Progress Notes (Signed)
 Updated contact information for sister:  Sarayu Prevost: [phone]

## 2024-05-26 NOTE — Progress Notes (Signed)
   05/26/24 1936  Provider Notification  Provider Name/Title [facility]  Date Provider Notified 05/26/24  Time Provider Notified 1936  Method of Notification Call  Notification Reason Change in status (CBG 31, then 28 later from different site. 1 amp (25g) of D50% given per hypoglycemia protocol)  Provider response Evaluate remotely   Hypoglycemic Event  CBG: 31 at 1925 then 28 repeat from different site at 1927  Treatment: D50 50 mL (25 gm) at 1933  Symptoms: None  Follow-up CBG: Time:1952 CBG Result:156  Possible Reasons for Event: Medication regimen: possible intolerance to earlier SSI administration and Unknown  Comments/MD notified: eLINK at 1936    Ruddy Corral RN

## 2024-05-26 NOTE — Progress Notes (Signed)
 Critical ABG results given to Dr Marygrace Snellen at 1059. pH 7.69 pCO2 26.9 pO2 457 HCO3  33.9 Decrease RR to 18 & repeat ABG once up to the ICU. Also decreased FIO2 to 40%.

## 2024-05-27 LAB — GLUCOSE, CAPILLARY
Glucose-Capillary: 80 mg/dL (ref 70–99)
Glucose-Capillary: 64 mg/dL — ABNORMAL LOW (ref 70–99)
Glucose-Capillary: 84 mg/dL (ref 70–99)
Glucose-Capillary: 111 mg/dL — ABNORMAL HIGH (ref 70–99)
Glucose-Capillary: 134 mg/dL — ABNORMAL HIGH (ref 70–99)
Glucose-Capillary: 111 mg/dL — ABNORMAL HIGH (ref 70–99)
Glucose-Capillary: 116 mg/dL — ABNORMAL HIGH (ref 70–99)

## 2024-05-27 LAB — BASIC METABOLIC PANEL WITH GFR
Anion gap: 8 (ref 5–15)
BUN: 24 mg/dL — ABNORMAL HIGH (ref 8–23)
CO2: 30 mmol/L (ref 22–32)
Calcium: 8.8 mg/dL — ABNORMAL LOW (ref 8.9–10.3)
Chloride: 96 mmol/L — ABNORMAL LOW (ref 98–111)
Creatinine, Ser: 0.44 mg/dL (ref 0.44–1.00)
GFR, Estimated: >60 mL/min (ref >60)
Glucose, Bld: 101 mg/dL — ABNORMAL HIGH (ref 70–99)
Potassium: 3.5 mmol/L (ref 3.5–5.1)
Sodium: 134 mmol/L — ABNORMAL LOW (ref 135–145)

## 2024-05-27 LAB — POCT I-STAT 7, (LYTES, BLD GAS, ICA,H+H)
Acid-Base Excess: 6 mmol/L — ABNORMAL HIGH (ref 0.0–2.0)
Bicarbonate: 30.3 mmol/L — ABNORMAL HIGH (ref 20.0–28.0)
Calcium, Ion: 1.3 mmol/L (ref 1.15–1.40)
HCT: 30 % — ABNORMAL LOW (ref 36.0–46.0)
Hemoglobin: 10.2 g/dL — ABNORMAL LOW (ref 12.0–15.0)
O2 Saturation: 100 %
Patient temperature: 37.5
Potassium: 3.7 mmol/L (ref 3.5–5.1)
Sodium: 133 mmol/L — ABNORMAL LOW (ref 135–145)
TCO2: 32 mmol/L (ref 22–32)
pCO2 arterial: 41.5 mmHg (ref 32–48)
pH, Arterial: 7.473 — ABNORMAL HIGH (ref 7.35–7.45)
pO2, Arterial: 165 mmHg — ABNORMAL HIGH (ref 83–108)

## 2024-05-27 LAB — CBC
HCT: 30.3 % — ABNORMAL LOW (ref 36.0–46.0)
Hemoglobin: 10.2 g/dL — ABNORMAL LOW (ref 12.0–15.0)
MCH: 30.1 pg (ref 26.0–34.0)
MCHC: 33.7 g/dL (ref 30.0–36.0)
MCV: 89.4 fL (ref 80.0–100.0)
Platelets: 170 10*3/uL (ref 150–400)
RBC: 3.39 MIL/uL — ABNORMAL LOW (ref 3.87–5.11)
RDW: 13.2 % (ref 11.5–15.5)
WBC: 7.1 10*3/uL (ref 4.0–10.5)
nRBC: 0 % (ref 0.0–0.2)

## 2024-05-27 LAB — PHOSPHORUS
Phosphorus: 1.9 mg/dL — ABNORMAL LOW (ref 2.5–4.6)
Phosphorus: 2.1 mg/dL — ABNORMAL LOW (ref 2.5–4.6)
Phosphorus: 3.4 mg/dL (ref 2.5–4.6)

## 2024-05-27 LAB — MAGNESIUM: Magnesium: 1.3 mg/dL — ABNORMAL LOW (ref 1.7–2.4)

## 2024-05-27 MED ORDER — PROSOURCE TF20 ENFIT COMPATIBL EN LIQD: 60.0000 mL | Freq: Every day | ENTERAL | Status: DC

## 2024-05-27 MED ORDER — POTASSIUM CHLORIDE 20 MEQ PO PACK
40.0000 meq | PACK | Freq: Once | ORAL | Status: AC
  Administered 2024-05-27: 40 meq
  Filled 2024-05-27: qty 2

## 2024-05-27 MED ORDER — THIAMINE MONONITRATE 100 MG PO TABS
100.0000 mg | ORAL_TABLET | Freq: Every day | ORAL | Status: DC
  Administered 2024-05-27 – 2024-05-30 (×4): 100 mg
  Filled 2024-05-27 (×4): qty 1

## 2024-05-27 MED ORDER — MAGNESIUM SULFATE 2 GM/50ML IV SOLN
2.0000 g | Freq: Once | INTRAVENOUS | Status: AC
  Administered 2024-05-27: 2 g via INTRAVENOUS
  Filled 2024-05-27: qty 50

## 2024-05-27 MED ORDER — POTASSIUM & SODIUM PHOSPHATES 280-160-250 MG PO PACK
2.0000 | PACK | ORAL | Status: AC
  Administered 2024-05-27 (×4): 2
  Filled 2024-05-27: qty 1
  Filled 2024-05-27 (×4): qty 2

## 2024-05-27 MED ORDER — LACTATED RINGERS BOLUS PEDS
1000.0000 mL | Freq: Once | INTRAVENOUS | Status: AC
  Administered 2024-05-27: 1000 mL via INTRAVENOUS

## 2024-05-27 MED ORDER — OSMOLITE 1.2 CAL PO LIQD
1000.0000 mL | ORAL | Status: DC
  Administered 2024-05-27 – 2024-05-30 (×4): 1000 mL
  Filled 2024-05-27 (×5): qty 1000

## 2024-05-27 MED ORDER — MAGNESIUM SULFATE 4 GM/100ML IV SOLN
4.0000 g | Freq: Once | INTRAVENOUS | Status: AC
  Administered 2024-05-27: 4 g via INTRAVENOUS
  Filled 2024-05-27: qty 100

## 2024-05-27 MED ORDER — VITAL HIGH PROTEIN PO LIQD: 1000.0000 mL | ORAL | Status: DC

## 2024-05-27 NOTE — Progress Notes (Signed)
 Initial Nutrition Assessment  DOCUMENTATION CODES:  Underweight, Severe malnutrition in context of chronic illness  INTERVENTION:  Initiate tube feeding via PEG: Osmolite 1.2 at 55 ml/h (1320 ml per day) Start at 25 and advance by 10mL q12h to goal of 55 Provides 1584 kcal, 73 gm protein, 1082 ml free water daily Monitor magnesium and phosphorus every 12 hours x 4 occurrences, MD to replete as needed, as pt is at risk for refeeding syndrome given underweight BMI and malnutrition Thiamine 100mg  x 7 days  NUTRITION DIAGNOSIS:  Severe Malnutrition related to chronic illness (ALS) as evidenced by severe muscle depletion, severe fat depletion.  GOAL:  Patient will meet greater than or equal to 90% of their needs  MONITOR:   TF tolerance, Vent status, Labs, Skin  REASON FOR ASSESSMENT:   Ventilator, Consult Enteral/tube feeding initiation and management  ASSESSMENT:  Pt with hx of ALS with trach and PEG, HTN, and HLD presented to ED from [facility] with difficulty breathing and AMS. Underwent trach decannulation over the last 2-3 days at [facility].  Noted shellfish allergy   Patient is currently intubated on ventilator support. Pt resting in bed at the time of assessment and able to nod head and open eyes. On exam, pt extremely cachectic with severe loss of muscle and fat stores. Also showing signs of refeeding when TF were initiated.   Reviewed records from [facility] and pt apparently on Osmolite 1.5 @ 85mL/h which would provide 2340kcal and 98g of protein. This is excessive based on pt's current weight and could lead to overfeeding.  MV: 8.1 L/min Temp (24hrs), Avg:99.2 F (37.3 C), Min:97.5 F (36.4 C), Max:100.6 F (38.1 C) MAP (cuff): 88 mmHg  Admit weight: 38.1 kg ? accuracy  Current weight: 42.2 kg    Intake/Output Summary (Last 24 hours) at 05/27/2024 1459 Last data filed at 05/27/2024 1418 Gross per 24 hour  Intake 3849.85 ml  Output 1080 ml  Net 2769.85 ml  Net IO Since  Admission: 5,083.17 mL [05/27/24 1459]  Drains/Lines: PICC Triple Lumen PEG LLQ  UOP x 24 hours  Nutritionally Relevant Medications: Scheduled Meds:  docusate  100 mg Per Tube BID   famotidine  20 mg Per Tube Daily   polyethylene glycol  17 g Per Tube Daily   potassium & sodium phosphates  2 packet Per Tube Q4H   sodium chloride flush  10-40 mL Intracatheter Q12H   Continuous Infusions:  ceFEPime (MAXIPIME) IV Stopped (05/26/24 2234)   magnesium sulfate bolus IVPB 50 mL/hr at 05/27/24 0800   norepinephrine (LEVOPHED) Adult infusion 2 mcg/min (05/27/24 0800)   vancomycin     PRN Meds:.docusate sodium, ondansetron, polyethylene glycol  Labs Reviewed: Sodium 133, Chloride 96 BUN 24 Phosphorus 1.9 Magnesium 1.3 CBG ranges from 28-156 mg/dL over the last 24 hours HgbA1c 4.9%  NUTRITION - FOCUSED PHYSICAL EXAM: Flowsheet Row Most Recent Value  Orbital Region Severe depletion  Upper Arm Region Severe depletion  Thoracic and Lumbar Region Severe depletion  Buccal Region Severe depletion  Temple Region Moderate depletion  Clavicle Bone Region Severe depletion  Clavicle and Acromion Bone Region Severe depletion  Scapular Bone Region Severe depletion  Dorsal Hand Unable to assess  [mittens]  Patellar Region Severe depletion  Anterior Thigh Region Severe depletion  Posterior Calf Region Severe depletion  Edema (RD Assessment) None  Hair Reviewed  Eyes Reviewed  Mouth Reviewed  Skin Reviewed  Nails Reviewed    Diet Order:   Diet Order  Diet NPO time specified  Diet effective now                   EDUCATION NEEDS:   Not appropriate for education at this time  Skin:  Skin Assessment: Reviewed RN Assessment Coccyx Stage 3 (1.5 cm x 1 cm)  Last BM:  6/2  Height:  Ht Readings from Last 1 Encounters:  05/26/24 5\' 4"  (1.626 m)    Weight:  Wt Readings from Last 1 Encounters:  05/27/24 42.2 kg   Ideal Body Weight:  54.5 kg  BMI:  Body  mass index is 15.97 kg/m.  Estimated Nutritional Needs:  Kcal:  1400-1600 kcal/d Protein:  65-80g/d Fluid:  1.5L/d    Edwena Graham, RD, LDN Registered Dietitian II Please reach out via secure chat

## 2024-05-27 NOTE — TOC Progression Note (Signed)
 Transition of Care [facility]) - Progression Note    Patient Details  Name: Dominique Brewer MRN: [id number] Date of Birth: [birth year]  Transition of Care [facility]) CM/SW Contact  Juliane Och, LCSW Phone Number: 05/27/2024, 12:23 PM  Clinical Narrative:     12:23 PM CSW provided hospitalist with [facility] DON contact information to inquire about patient's core body temperature at arrival to ED. DON is Cordella Deter [phone], [email or web address]).  Expected Discharge Plan: Skilled Nursing Facility Barriers to Discharge: Continued Medical Work up  Expected Discharge Plan and Services In-house Referral: Clinical Social Work   Post Acute Care Choice: Skilled Nursing Facility Living arrangements for the past 2 months: Skilled Nursing Facility                                       Social Determinants of Health (SDOH) Interventions SDOH Screenings   Food Insecurity: Low Risk  (03/07/2024)   Received from [facility]  Housing: Low Risk  (03/07/2024)   Received from [facility]  Transportation Needs: No Transportation Needs (03/07/2024)   Received from [facility]  Utilities: Low Risk  (03/07/2024)   Received from [facility]  Financial Resource Strain: High Risk (06/08/2023)   Received from [facility]  Physical Activity: Unknown (12/08/2022)   Received from [facility]  Social Connections: Socially Isolated (12/08/2022)   Received from [facility]  Stress: No Stress Concern Present (12/08/2022)   Received from [facility]  Tobacco Use: Low Risk  (05/26/2024)    Readmission Risk Interventions     No data to display

## 2024-05-27 NOTE — Consult Note (Addendum)
 WOC Nurse Consult Note: Reason for Consult: sacral wound  Wound type:  Stage 3  pressure injury sacrum  Pressure Injury POA: Yes Measurement: see nursing flowsheet  Wound WJX:BJYNWG PI appears pink and moist, lateral edge of buttocks appears dark with ?developing DTPI  Drainage (amount, consistency, odor) see nursing flowsheet  Periwound: appears macerated  Dressing procedure/placement/frequency: Cleanse sacrum and buttocks with Vashe wound cleanser Timm Foot [phone]) do not rinse and allow to air dry. Apply Xeroform gauze (Lawson [phone]) to wound bed daily, cover with dry gauze and silicone foam or ABD pad whichever is preferred.   POC discussed with bedside nurse. WOC team will not follow. Re-consult if further needs arise.    Thank you,    Ronni Colace MSN, RN-BC, [employer] [phone]

## 2024-05-27 NOTE — Progress Notes (Signed)
 [facility] ADULT ICU REPLACEMENT PROTOCOL   The patient does apply for the [facility] Adult ICU Electrolyte Replacment Protocol based on the criteria listed below:   1.Exclusion criteria: TCTS, ECMO, Dialysis, and Myasthenia Gravis patients 2. Is GFR >/= 30 ml/min? Yes.    Patient's GFR today is >60 3. Is SCr </= 2? Yes.   Patient's SCr is 0.44 mg/dL 4. Did SCr increase >/= 0.5 in 24 hours? No. 5.Pt's weight >40kg  Yes.   6. Abnormal electrolyte(s): K+ = 3.5, Phos = 1.9, mg = 1.3  7. Electrolytes replaced per protocol 8.  Call MD STAT for K+ </= 2.5, Phos </= 1, or Mag </= 1 Physician:  Rito Chess, eMD  Jeffry Minister 05/27/2024 5:52 AM

## 2024-05-27 NOTE — H&P (Signed)
 NAME:  Dominique Brewer, MRN:  [id number], DOB:  12-27-1945, LOS: 1 ADMISSION DATE:  05/26/2024, CONSULTATION DATE:  05/26/24 REFERRING MD:  EDP CHIEF COMPLAINT:  AHRF, encephalopathy   History of Present Illness:  Patient is a 78 year old female with pertinent medical hx of ALS with complication of acute hypercarbic respiratory failure requiring tracheostomy, HTN, HLD. She had tracheostomy removed on Friday and became altered since Saturday 05/31.  She was brought to ED via EMS from [facility] and found to be hypothermic at 89.2 F, hypertensive and encephalopathic. Per RN, she was not following any commands. Her respiratory status was deteriorating and she was intubated by the EDP. Given hypothermia, sepsis protocol was initiated and she was started on Cefepime, Vancomycin.   I spoke with nursing staff and [facility] and they stated pt was decannulated on Friday and when her nurse found her this am, she was not responding and had agonal breathing. She would withdraw to pain per their report. She was also hypotensive at 80s/40s which improved after IVF. They reported at baseline pt was able to communicate and use walker to move.   After further discussion with the nursing staff at [facility].  They reported patient tolerated trach capping for the 2 days prior to decannulation and was refusing vent support at night which was the recommendation.  Given these findings, decision was made to decannulate her.  Lab work showed normal CBC, BMP with some electrolyte disturbances but normal creatinine. Normal hepatic fxn, normal ammonia.   Imaging was notable for normal CXR and normal CTH.   Pertinent  Medical History  ALS HTN HLD Atrial Tachycardia  Significant Hospital Events: Including procedures, antibiotic start and stop dates in addition to other pertinent events   06/02: presented to the ED with encephalopathy, respiratory failure. Intubated and transferred to the ICU  Interim History / Subjective:  On  my exam, pt is intubated and sedated. She is not following any commands.   Objective    Blood pressure (!) 105/44, pulse 73, temperature 99 F (37.2 C), resp. rate 18, height 5\' 4"  (1.626 m), weight 42.2 kg, SpO2 100%.    Vent Mode: PRVC FiO2 (%):  [40 %] 40 % Set Rate:  [18 bmp] 18 bmp Vt Set:  [440 mL] 440 mL PEEP:  [5 cmH20] 5 cmH20 Plateau Pressure:  [15 cmH20-17 cmH20] 16 cmH20   Intake/Output Summary (Last 24 hours) at 05/27/2024 1044 Last data filed at 05/27/2024 1000 Gross per 24 hour  Intake 4816.83 ml  Output 905 ml  Net 3911.83 ml   Filed Weights   05/26/24 0945 05/26/24 1142 05/27/24 0404  Weight: 38.1 kg 38.9 kg 42.2 kg    Examination: General: intubated and sedated HENT: intubated Lungs: CTAB, good air movement Cardiovascular: normal heart sounds, slightly bradycardic, well perfused Abdomen: soft abdomen Extremities: no asymmetry Neuro: sedated, unable to assess GU: no foley  Labs   CBC: Recent Labs  Lab 05/26/24 0959 05/26/24 1054 05/26/24 1318 05/27/24 0525 05/27/24 0532  WBC 5.8  --   --  7.1  --   NEUTROABS 5.3  --   --   --   --   HGB 14.3 13.3 11.2* 10.2* 10.2*  HCT 44.5 39.0 33.0* 30.3* 30.0*  MCV 92.5  --   --  89.4  --   PLT 261  --   --  170  --    Basic Metabolic Panel: Recent Labs  Lab 05/26/24 0953 05/26/24 0959 05/26/24 1054 05/26/24 1318 05/27/24  1478 05/27/24 0532 05/27/24 0844  NA 129*  130* 128* 130* 130* 134* 133*  --   K 4.2  4.3 3.9 3.6 4.0 3.5 3.7  --   CL 87* 84*  --   --  96*  --   --   CO2  --  36*  --   --  30  --   --   GLUCOSE 138* 134*  --   --  101*  --   --   BUN 34* 24*  --   --  24*  --   --   CREATININE 0.60 0.33*  --   --  0.44  --   --   CALCIUM  --  9.8  --   --  8.8*  --   --   MG  --   --   --   --  1.3*  --   --   PHOS  --   --   --   --  1.9*  --  2.1*   GFR: Estimated Creatinine Clearance: 38.6 mL/min (by C-G formula based on SCr of 0.44 mg/dL). Recent Labs  Lab 05/26/24 0954  05/26/24 0959 05/27/24 0525  PROCALCITON  --  <0.10  --   WBC  --  5.8 7.1  LATICACIDVEN 0.9  --   --    Liver Function Tests: Recent Labs  Lab 05/26/24 0959  AST 25  ALT 31  ALKPHOS 85  BILITOT 0.4  PROT 7.3  ALBUMIN 3.7   No results for input(s): "LIPASE", "AMYLASE" in the last 168 hours. Recent Labs  Lab 05/26/24 0959  AMMONIA 24   ABG    Component Value Date/Time   PHART 7.473 (H) 05/27/2024 0532   PCO2ART 41.5 05/27/2024 0532   PO2ART 165 (H) 05/27/2024 0532   HCO3 30.3 (H) 05/27/2024 0532   TCO2 32 05/27/2024 0532   O2SAT 100 05/27/2024 0532    Coagulation Profile: No results for input(s): "INR", "PROTIME" in the last 168 hours. Cardiac Enzymes: Recent Labs  Lab 05/26/24 0959  CKTOTAL 44   HbA1C: Hgb A1c MFr Bld  Date/Time Value Ref Range Status  05/26/2024 04:31 PM 4.9 4.8 - 5.6 % Final    Comment:    (NOTE) Diagnosis of Diabetes The following HbA1c ranges recommended by the American Diabetes Association (ADA) may be used as an aid in the diagnosis of diabetes mellitus.  Hemoglobin             Suggested A1C NGSP%              Diagnosis  <5.7                   Non Diabetic  5.7-6.4                Pre-Diabetic  >6.4                   Diabetic  <7.0                   Glycemic control for                       adults with diabetes.     CBG: Recent Labs  Lab 05/26/24 1952 05/26/24 2324 05/27/24 0313 05/27/24 0737 05/27/24 0824  GLUCAP 156* 80 80 64* 84    Consults   Assessment & Plan:   Acute on Chronic hypercarbic respiratory failure: In the setting of  neuromuscular weakness related to ALS.  This is a progressive illness.  Unfortunately, she had decompensation after tracheostomy was removed.  I suspect if goals of care are consistent with prior goals of care, she will require redo tracheostomy for ongoing care moving forward. -- Chest x-ray is clear -- Will continue antibiotics for now, procalcitonin negative -- PRVC, stress  ulcer prophylaxis, VAP bundle   Hypotension: High suspicion for hypovolemia driving as well as related to sedation.  Occult sepsis considered but no clear source, chest x-ray clear, UA okay. BP gradually improved pressor requirement improving overall with fluids. 4L crystalloid given via ED and in unit first day of admission -- Additional 1 L LR bolus -- Continue norepinephrine, vaso, MAP goal 65 -- Antibiotics as above, if blood cultures negative at 48 hrs plan to d/c abx   Hyponatremia: With hyperchloremia, points to volume contraction, hypovolemia. Improving with volume resuscitation. --start TF   ALS: Diagnosed via EMG, bulbar predominant.  This is a progressive disorder. -- Goals of care with sister when able, plan to replace trach once stabilized, suspect will require lifelong trach   Failure to thrive in adult, severe protein calorie malnutrition: --Start TF  Hypomagnesemia and hypophosphatemia --replace   Best Practice (right click and "Reselect all SmartList Selections" daily)  Diet/type: TF DVT prophylaxis: LMWH GI prophylaxis: PPI Lines: N/A Foley:  N/A Continuous: Fentanyl, versed  Code Status:  full code Last date of multidisciplinary goals of care discussion [06/02 with [facility] Medications  Prior to Admission medications   Medication Sig Start Date End Date Taking? Authorizing Provider  CARDIZEM LA 120 MG 24 hr tablet  04/26/18   [provider]  latanoprost (XALATAN) 0.005 % ophthalmic solution Place 1 drop into both eyes at bedtime.    [provider]  Olmesartan-Amlodipine-HCTZ (TRIBENZOR) 20-5-12.5 MG TABS Take by mouth daily.    [provider]    Allergies Allergies  Allergen Reactions   Iodine Anaphylaxis   Shellfish Allergy Anaphylaxis    Due to iodine content   Zymaxid [Gatifloxacin] Other (See Comments)    Weakness to legs causing frequent falls; inability to stand or walk   Amlodipine Swelling    Leg and ankle  swelling    Aleve [Naproxen] Swelling   Penicillins Other (See Comments)    Fever and rash Tolerated cephalosporins    Adoxa [Doxycycline] Other (See Comments)    Dizziness    Ak-Mycin [Erythromycin] Nausea Only   Benadryl Itch Stopping [Diphenhydramine-Zinc Acetate] Rash    Blistering rash Reaction to topical diphenhydramine    Codeine Other (See Comments)    Unknown reaction    CRITICAL CARE Performed by: Archer Kobs Ameya Kutz   Total critical care time: 33 minutes  Critical care time was exclusive of separately billable procedures and treating other patients.  Critical care was necessary to treat or prevent imminent or life-threatening deterioration.  Critical care was time spent personally by me on the following activities: development of treatment plan with patient and/or surrogate as well as nursing, discussions with consultants, evaluation of patient's response to treatment, examination of patient, obtaining history from patient or surrogate, ordering and performing treatments and interventions, ordering and review of laboratory studies, ordering and review of radiographic studies, pulse oximetry and re-evaluation of patient's condition.   Guerry Leek, MD See [place]il[place] Foley

## 2024-05-28 DIAGNOSIS — E43 Unspecified severe protein-calorie malnutrition: Secondary | ICD-10-CM | POA: Insufficient documentation

## 2024-05-28 LAB — PHOSPHORUS: Phosphorus: 3.7 mg/dL (ref 2.5–4.6)

## 2024-05-28 LAB — GLUCOSE, CAPILLARY
Glucose-Capillary: 112 mg/dL — ABNORMAL HIGH (ref 70–99)
Glucose-Capillary: 122 mg/dL — ABNORMAL HIGH (ref 70–99)
Glucose-Capillary: 127 mg/dL — ABNORMAL HIGH (ref 70–99)
Glucose-Capillary: 129 mg/dL — ABNORMAL HIGH (ref 70–99)
Glucose-Capillary: 136 mg/dL — ABNORMAL HIGH (ref 70–99)
Glucose-Capillary: 142 mg/dL — ABNORMAL HIGH (ref 70–99)

## 2024-05-28 LAB — BASIC METABOLIC PANEL WITH GFR
Anion gap: 7 (ref 5–15)
BUN: 21 mg/dL (ref 8–23)
CO2: 31 mmol/L (ref 22–32)
Calcium: 8.3 mg/dL — ABNORMAL LOW (ref 8.9–10.3)
Chloride: 96 mmol/L — ABNORMAL LOW (ref 98–111)
Creatinine, Ser: 0.32 mg/dL — ABNORMAL LOW (ref 0.44–1.00)
GFR, Estimated: >60 mL/min (ref >60)
Glucose, Bld: 127 mg/dL — ABNORMAL HIGH (ref 70–99)
Potassium: 3.7 mmol/L (ref 3.5–5.1)
Sodium: 134 mmol/L — ABNORMAL LOW (ref 135–145)

## 2024-05-28 LAB — MAGNESIUM: Magnesium: 1.9 mg/dL (ref 1.7–2.4)

## 2024-05-28 MED ORDER — LACTATED RINGERS IV BOLUS
1000.0000 mL | Freq: Once | INTRAVENOUS | Status: AC
  Administered 2024-05-28: 1000 mL via INTRAVENOUS

## 2024-05-28 MED ORDER — MIDODRINE HCL 5 MG PO TABS
10.0000 mg | ORAL_TABLET | Freq: Three times a day (TID) | ORAL | Status: DC
  Administered 2024-05-28 – 2024-05-29 (×3): 10 mg
  Filled 2024-05-28 (×3): qty 2

## 2024-05-28 MED ORDER — POTASSIUM CHLORIDE 20 MEQ PO PACK
40.0000 meq | PACK | Freq: Once | ORAL | Status: AC
  Administered 2024-05-28: 40 meq
  Filled 2024-05-28: qty 2

## 2024-05-28 MED ORDER — MIDODRINE HCL 5 MG PO TABS: 10.0000 mg | ORAL_TABLET | Freq: Three times a day (TID) | ORAL | Status: DC

## 2024-05-28 MED ORDER — NOREPINEPHRINE 16 MG/250ML-% IV SOLN
0.0000 ug/min | INTRAVENOUS | Status: DC
  Administered 2024-05-28: 2 ug/min via INTRAVENOUS
  Administered 2024-05-29: 8 ug/min via INTRAVENOUS
  Filled 2024-05-28: qty 250

## 2024-05-28 MED ORDER — MAGNESIUM SULFATE 2 GM/50ML IV SOLN
2.0000 g | Freq: Once | INTRAVENOUS | Status: AC
  Administered 2024-05-28: 2 g via INTRAVENOUS
  Filled 2024-05-28: qty 50

## 2024-05-28 NOTE — Plan of Care (Signed)

## 2024-05-28 NOTE — TOC Progression Note (Signed)
 Transition of Care [facility]) - Progression Note    Patient Details  Name: Dominique Brewer MRN: [id number] Date of Birth: [birth year]  [facility]) CM/SW Contact  Juliane Och, LCSW Phone Number: 05/28/2024, 11:21 AM  Clinical Narrative:     11:21 AM Per progressions, patient is not medically ready for discharge. Patient remains intubated.   Expected Discharge Plan: Skilled Nursing Facility Barriers to Discharge: Continued Medical Work up  Expected Discharge Plan and Services In-house Referral: Clinical Social Work   Post Acute Care Choice: Skilled Nursing Facility Living arrangements for the past 2 months: Skilled Nursing Facility                                       Social Determinants of Health (SDOH) Interventions SDOH Screenings   Food Insecurity: Low Risk  (03/07/2024)   Received from [facility]  Housing: Low Risk  (03/07/2024)   Received from [facility]  Transportation Needs: No Transportation Needs (03/07/2024)   Received from [facility]  Utilities: Low Risk  (03/07/2024)   Received from [facility]  Financial Resource Strain: High Risk (06/08/2023)   Received from [facility]  Physical Activity: Unknown (12/08/2022)   Received from [facility]  Social Connections: Socially Isolated (12/08/2022)   Received from [facility]  Stress: No Stress Concern Present (12/08/2022)   Received from [facility]  Tobacco Use: Low Risk  (05/26/2024)    Readmission Risk Interventions     No data to display

## 2024-05-28 NOTE — Progress Notes (Signed)
 NAME:  Dominique Brewer, MRN:  [id number], DOB:  [birth year], LOS: 2 ADMISSION DATE:  05/26/2024, CONSULTATION DATE:  05/26/24 REFERRING MD:  EDP CHIEF COMPLAINT:  AHRF, encephalopathy   History of Present Illness:  Patient is a 78 year old female with pertinent medical hx of ALS with complication of acute hypercarbic respiratory failure requiring tracheostomy, HTN, HLD. She had tracheostomy removed on Friday and became altered since Saturday 05/31.  She was brought to ED via EMS from [facility] and found to be hypothermic at 89.2 F, hypertensive and encephalopathic. Per RN, she was not following any commands. Her respiratory status was deteriorating and she was intubated by the EDP. Given hypothermia, sepsis protocol was initiated and she was started on Cefepime, Vancomycin.   I spoke with nursing staff and [facility] and they stated pt was decannulated on Friday and when her nurse found her this am, she was not responding and had agonal breathing. She would withdraw to pain per their report. She was also hypotensive at 80s/40s which improved after IVF. They reported at baseline pt was able to communicate and use walker to move.   After further discussion with the nursing staff at [facility].  They reported patient tolerated trach capping for the 2 days prior to decannulation and was refusing vent support at night which was the recommendation.  Given these findings, decision was made to decannulate her.  Lab work showed normal CBC, BMP with some electrolyte disturbances but normal creatinine. Normal hepatic fxn, normal ammonia.   Imaging was notable for normal CXR and normal CTH.   Pertinent  Medical History  ALS HTN HLD Atrial Tachycardia  Significant Hospital Events: Including procedures, antibiotic start and stop dates in addition to other pertinent events   06/02: presented to the ED with encephalopathy, respiratory failure. Intubated and transferred to the ICU  Interim History / Subjective:  On  my exam, pt is intubated and sedated. She is not following any commands.  On fentanyl for agitation.  Objective    Blood pressure (!) 139/48, pulse (!) 116, temperature (!) 100.4 F (38 C), resp. rate 18, height 5\' 4"  (1.626 m), weight 45.8 kg, SpO2 100%.    Vent Mode: PRVC FiO2 (%):  [40 %] 40 % Set Rate:  [18 bmp] 18 bmp Vt Set:  [440 mL] 440 mL PEEP:  [5 cmH20] 5 cmH20 Plateau Pressure:  [13 cmH20-18 cmH20] 18 cmH20   Intake/Output Summary (Last 24 hours) at 05/28/2024 1010 Last data filed at 05/28/2024 0900 Gross per 24 hour  Intake 2917.39 ml  Output 1030 ml  Net 1887.39 ml   Filed Weights   05/26/24 1142 05/27/24 0404 05/28/24 0403  Weight: 38.9 kg 42.2 kg 45.8 kg    Examination: General: intubated and sedated HENT: intubated Lungs: CTAB, good air movement Cardiovascular: normal heart sounds, slightly bradycardic, well perfused Abdomen: soft abdomen Extremities: no asymmetry Neuro: sedated, unable to assess GU: no foley  Labs   CBC: Recent Labs  Lab 05/26/24 0959 05/26/24 1054 05/26/24 1318 05/27/24 0525 05/27/24 0532  WBC 5.8  --   --  7.1  --   NEUTROABS 5.3  --   --   --   --   HGB 14.3 13.3 11.2* 10.2* 10.2*  HCT 44.5 39.0 33.0* 30.3* 30.0*  MCV 92.5  --   --  89.4  --   PLT 261  --   --  170  --    Basic Metabolic Panel: Recent Labs  Lab 05/26/24 0953 05/26/24  8295 05/26/24 1054 05/26/24 1318 05/27/24 0357 05/27/24 0532 05/27/24 0844 05/27/24 1721 05/28/24 0346  NA 129*  130* 128* 130* 130* 134* 133*  --   --  134*  K 4.2  4.3 3.9 3.6 4.0 3.5 3.7  --   --  3.7  CL 87* 84*  --   --  96*  --   --   --  96*  CO2  --  36*  --   --  30  --   --   --  31  GLUCOSE 138* 134*  --   --  101*  --   --   --  127*  BUN 34* 24*  --   --  24*  --   --   --  21  CREATININE 0.60 0.33*  --   --  0.44  --   --   --  0.32*  CALCIUM  --  9.8  --   --  8.8*  --   --   --  8.3*  MG  --   --   --   --  1.3*  --   --   --  1.9  PHOS  --   --   --   --  1.9*   --  2.1* 3.4 3.7   GFR: Estimated Creatinine Clearance: 41.9 mL/min (A) (by C-G formula based on SCr of 0.32 mg/dL (L)). Recent Labs  Lab 05/26/24 0954 05/26/24 0959 05/27/24 0525  PROCALCITON  --  <0.10  --   WBC  --  5.8 7.1  LATICACIDVEN 0.9  --   --    Liver Function Tests: Recent Labs  Lab 05/26/24 0959  AST 25  ALT 31  ALKPHOS 85  BILITOT 0.4  PROT 7.3  ALBUMIN 3.7   No results for input(s): "LIPASE", "AMYLASE" in the last 168 hours. Recent Labs  Lab 05/26/24 0959  AMMONIA 24   ABG    Component Value Date/Time   PHART 7.473 (H) 05/27/2024 0532   PCO2ART 41.5 05/27/2024 0532   PO2ART 165 (H) 05/27/2024 0532   HCO3 30.3 (H) 05/27/2024 0532   TCO2 32 05/27/2024 0532   O2SAT 100 05/27/2024 0532    Coagulation Profile: No results for input(s): "INR", "PROTIME" in the last 168 hours. Cardiac Enzymes: Recent Labs  Lab 05/26/24 0959  CKTOTAL 44   HbA1C: Hgb A1c MFr Bld  Date/Time Value Ref Range Status  05/26/2024 04:31 PM 4.9 4.8 - 5.6 % Final    Comment:    (NOTE) Diagnosis of Diabetes The following HbA1c ranges recommended by the American Diabetes Association (ADA) may be used as an aid in the diagnosis of diabetes mellitus.  Hemoglobin             Suggested A1C NGSP%              Diagnosis  <5.7                   Non Diabetic  5.7-6.4                Pre-Diabetic  >6.4                   Diabetic  <7.0                   Glycemic control for  adults with diabetes.     CBG: Recent Labs  Lab 05/27/24 1133 05/27/24 1512 05/27/24 1913 05/27/24 2317 05/28/24 0324  GLUCAP 111* 134* 111* 116* 142*    Consults   Assessment & Plan:   Acute on Chronic hypercarbic respiratory failure: In the setting of neuromuscular weakness related to ALS.  This is a progressive illness.  Unfortunately, she had decompensation after tracheostomy was removed.  I suspect if goals of care are consistent with prior goals of care, she will  require redo tracheostomy for ongoing care moving forward. -- Chest x-ray is clear -- Will continue antibiotics for now, procalcitonin negative -- PRVC, stress ulcer prophylaxis, VAP bundle --SBT/SAT as able, plan re-do trach   Hypotension: High suspicion for hypovolemia driving as well as related to sedation.  Occult sepsis considered but no clear source, chest x-ray clear, UA okay. BP gradually improved pressor requirement improving overall with fluids. 4L crystalloid given via ED and in unit first day of admission -- pressors weaned off, MAP goal 65 -- Antibiotics as above, if blood cultures negative at 48 hrs plan to d/c abx   Hyponatremia: With hyperchloremia, points to volume contraction, hypovolemia. Improving with volume resuscitation. --TF   ALS: Diagnosed via EMG, bulbar predominant.  This is a progressive disorder. -- Goals of care with sister when able, plan to replace trach once stabilized, suspect will require lifelong trach   Failure to thrive in adult, severe protein calorie malnutrition: --TF  Hypomagnesemia and hypophosphatemia --replace   Best Practice (right click and "Reselect all SmartList Selections" daily)  Diet/type: TF DVT prophylaxis: LMWH GI prophylaxis: PPI Lines: N/A Foley:  N/A Continuous: Fentanyl, versed  Code Status:  full code Last date of multidisciplinary goals of care discussion [have attempted on multiple occasions to contact designated decision-maker, sister, listed in chart, no answer, no option for voicemail]  Home Medications  Prior to Admission medications   Medication Sig Start Date End Date Taking? Authorizing Provider  CARDIZEM LA 120 MG 24 hr tablet  04/26/18   [provider]  latanoprost (XALATAN) 0.005 % ophthalmic solution Place 1 drop into both eyes at bedtime.    [provider]  Olmesartan-Amlodipine-HCTZ (TRIBENZOR) 20-5-12.5 MG TABS Take by mouth daily.    [provider]    Allergies Allergies   Allergen Reactions   Iodine Anaphylaxis   Shellfish Allergy Anaphylaxis    Due to iodine content   Zymaxid [Gatifloxacin] Other (See Comments)    Weakness to legs causing frequent falls; inability to stand or walk   Amlodipine Swelling    Leg and ankle swelling    Aleve [Naproxen] Swelling   Penicillins Other (See Comments)    Fever and rash Tolerated cephalosporins    Adoxa [Doxycycline] Other (See Comments)    Dizziness    Ak-Mycin [Erythromycin] Nausea Only   Benadryl Itch Stopping [Diphenhydramine-Zinc Acetate] Rash    Blistering rash Reaction to topical diphenhydramine    Codeine Other (See Comments)    Unknown reaction    CRITICAL CARE Performed by: Archer Kobs Karolyna Bianchini   Total critical care time: 31 minutes  Critical care time was exclusive of separately billable procedures and treating other patients.  Critical care was necessary to treat or prevent imminent or life-threatening deterioration.  Critical care was time spent personally by me on the following activities: development of treatment plan with patient and/or surrogate as well as nursing, discussions with consultants, evaluation of patient's response to treatment, examination of patient, obtaining history from patient  or surrogate, ordering and performing treatments and interventions, ordering and review of laboratory studies, ordering and review of radiographic studies, pulse oximetry and re-evaluation of patient's condition.   Guerry Leek, MD See [place] Foley

## 2024-05-28 NOTE — Progress Notes (Signed)
 [facility] ADULT ICU REPLACEMENT PROTOCOL   The patient does apply for the [facility] Adult ICU Electrolyte Replacment Protocol based on the criteria listed below:   1.Exclusion criteria: TCTS, ECMO, Dialysis, and Myasthenia Gravis patients 2. Is GFR >/= 30 ml/min? Yes.    Patient's GFR today is >60 3. Is SCr </= 2? Yes.   Patient's SCr is 0.32 mg/dL 4. Did SCr increase >/= 0.5 in 24 hours? No. 5.Pt's weight >40kg  Yes.   6. Abnormal electrolyte(s): K+ = 3.7, Mg = 1.9  7. Electrolytes replaced per protocol 8.  Call MD STAT for K+ </= 2.5, Phos </= 1, or Mag </= 1 Physician:  Rito Chess, eMD   Alison Applebaum Neville Pauls 05/28/2024 6:09 AM

## 2024-05-29 ENCOUNTER — Inpatient Hospital Stay (HOSPITAL_COMMUNITY)

## 2024-05-29 DIAGNOSIS — J9621 Acute and chronic respiratory failure with hypoxia: Secondary | ICD-10-CM

## 2024-05-29 LAB — GLUCOSE, CAPILLARY
Glucose-Capillary: 112 mg/dL — ABNORMAL HIGH (ref 70–99)
Glucose-Capillary: 120 mg/dL — ABNORMAL HIGH (ref 70–99)
Glucose-Capillary: 125 mg/dL — ABNORMAL HIGH (ref 70–99)
Glucose-Capillary: 129 mg/dL — ABNORMAL HIGH (ref 70–99)
Glucose-Capillary: 137 mg/dL — ABNORMAL HIGH (ref 70–99)
Glucose-Capillary: 149 mg/dL — ABNORMAL HIGH (ref 70–99)

## 2024-05-29 LAB — CBC
HCT: 30 % — ABNORMAL LOW (ref 36.0–46.0)
Hemoglobin: 9.8 g/dL — ABNORMAL LOW (ref 12.0–15.0)
MCH: 30 pg (ref 26.0–34.0)
MCHC: 32.7 g/dL (ref 30.0–36.0)
MCV: 91.7 fL (ref 80.0–100.0)
Platelets: 140 10*3/uL — ABNORMAL LOW (ref 150–400)
RBC: 3.27 MIL/uL — ABNORMAL LOW (ref 3.87–5.11)
RDW: 13.9 % (ref 11.5–15.5)
WBC: 9.9 10*3/uL (ref 4.0–10.5)
nRBC: 0 % (ref 0.0–0.2)

## 2024-05-29 LAB — BASIC METABOLIC PANEL WITH GFR
Anion gap: 7 (ref 5–15)
BUN: 16 mg/dL (ref 8–23)
CO2: 31 mmol/L (ref 22–32)
Calcium: 8.9 mg/dL (ref 8.9–10.3)
Chloride: 96 mmol/L — ABNORMAL LOW (ref 98–111)
Creatinine, Ser: 0.4 mg/dL — ABNORMAL LOW (ref 0.44–1.00)
GFR, Estimated: >60 mL/min (ref >60)
Glucose, Bld: 130 mg/dL — ABNORMAL HIGH (ref 70–99)
Potassium: 4.6 mmol/L (ref 3.5–5.1)
Sodium: 134 mmol/L — ABNORMAL LOW (ref 135–145)

## 2024-05-29 LAB — PHOSPHORUS: Phosphorus: 2.9 mg/dL (ref 2.5–4.6)

## 2024-05-29 MED ORDER — MIDODRINE HCL 5 MG PO TABS
15.0000 mg | ORAL_TABLET | Freq: Three times a day (TID) | ORAL | Status: DC
  Administered 2024-05-29 – 2024-05-30 (×4): 15 mg
  Filled 2024-05-29 (×4): qty 3

## 2024-05-29 MED ORDER — LIDOCAINE-EPINEPHRINE (PF) 2 %-1:200000 IJ SOLN
INTRAMUSCULAR | Status: AC
  Filled 2024-05-29: qty 20

## 2024-05-29 MED ORDER — LIDOCAINE HCL (PF) 1 % IJ SOLN
INTRAMUSCULAR | Status: AC
  Filled 2024-05-29: qty 5

## 2024-05-29 MED ORDER — ROCURONIUM BROMIDE 10 MG/ML (PF) SYRINGE
100.0000 mg | PREFILLED_SYRINGE | Freq: Once | INTRAVENOUS | Status: AC
  Administered 2024-05-29: 100 mg via INTRAVENOUS
  Filled 2024-05-29: qty 10

## 2024-05-29 MED ORDER — FENTANYL CITRATE PF 50 MCG/ML IJ SOSY
200.0000 ug | PREFILLED_SYRINGE | Freq: Once | INTRAMUSCULAR | Status: AC
  Administered 2024-05-29: 100 ug via INTRAVENOUS
  Filled 2024-05-29: qty 4

## 2024-05-29 MED ORDER — ETOMIDATE 2 MG/ML IV SOLN
INTRAVENOUS | Status: AC
  Administered 2024-05-29: 20 mg
  Filled 2024-05-29: qty 10

## 2024-05-29 MED ORDER — SENNA 8.6 MG PO TABS
1.0000 | ORAL_TABLET | Freq: Two times a day (BID) | ORAL | Status: DC
  Administered 2024-05-29 – 2024-05-30 (×3): 8.6 mg
  Filled 2024-05-29 (×3): qty 1

## 2024-05-29 MED ORDER — LIDOCAINE-EPINEPHRINE 1 %-1:100000 IJ SOLN
20.0000 mL | Freq: Once | INTRAMUSCULAR | Status: AC
  Administered 2024-05-29: 20 mL via INTRADERMAL
  Filled 2024-05-29: qty 1

## 2024-05-29 MED ORDER — ETOMIDATE 2 MG/ML IV SOLN
20.0000 mg | Freq: Once | INTRAVENOUS | Status: AC
  Filled 2024-05-29: qty 10

## 2024-05-29 MED ORDER — MIDAZOLAM HCL 2 MG/2ML IJ SOLN
5.0000 mg | Freq: Once | INTRAMUSCULAR | Status: AC
  Administered 2024-05-29: 2 mg via INTRAVENOUS
  Filled 2024-05-29: qty 6

## 2024-05-29 NOTE — Plan of Care (Addendum)
 No significant events overnight. Patient will arouse to minimal stimulation and voice. Will weakly follow commands and occasionally will Nod "Yes" or "No" when asked if in pain.  Occasionally breathes over the vent. ICU status maintained.    Appropriately.  Problem: Coping: Goal: Level of anxiety will decrease Outcome: Progressing   Problem: Nutrition: Goal: Adequate nutrition will be maintained Outcome: Progressing

## 2024-05-29 NOTE — TOC Progression Note (Signed)
 Transition of Care [facility]) - Progression Note    Patient Details  Name: Dominique Brewer MRN: [id number] Date of Birth: July 28, 1946  Transition of Care [facility]) CM/SW Contact  Juliane Och, LCSW Phone Number: 05/29/2024, 11:45 AM  Clinical Narrative:     11:45 AM CSW confirmed contact information of patient's sister, [name] [phone]). [name] confirmed that she is able to be contacted any day, anytime as she is retired. CSW relayed information to medical team.  Expected Discharge Plan: Skilled Nursing Facility Barriers to Discharge: Continued Medical Work up  Expected Discharge Plan and Services In-house Referral: Clinical Social Work   Post Acute Care Choice: Skilled Nursing Facility Living arrangements for the past 2 months: Skilled Nursing Facility                                       Social Determinants of Health (SDOH) Interventions SDOH Screenings   Food Insecurity: Low Risk  (03/07/2024)   Received from [facility]  Housing: Low Risk  (03/07/2024)   Received from [facility]  Transportation Needs: No Transportation Needs (03/07/2024)   Received from [facility]  Utilities: Low Risk  (03/07/2024)   Received from [facility]  Financial Resource Strain: High Risk (06/08/2023)   Received from [facility]  Physical Activity: Unknown (12/08/2022)   Received from [facility]  Social Connections: Socially Isolated (12/08/2022)   Received from [facility]  Stress: No Stress Concern Present (12/08/2022)   Received from [facility]  Tobacco Use: Low Risk  (05/26/2024)    Readmission Risk Interventions     No data to display

## 2024-05-29 NOTE — Procedures (Signed)
 Percutaneous Tracheostomy Procedure Note   Dominique Brewer  [id number]  12/14/1946  Date:05/29/24  Time:3:48 PM   Provider Performing:Kanav Kazmierczak  Procedure: Percutaneous Tracheostomy with Bronchoscopic Guidance (40981)  Indication(s) Acute respiratory failure with hypoxia  Consent Risks of the procedure as well as the alternatives and risks of each were explained to the patient and/or caregiver.  Consent for the procedure was obtained.  Anesthesia Etomidate, Versed, Fentanyl, Vecuronium   Time Out Verified patient identification, verified procedure, site/side was marked, verified correct patient position, special equipment/implants available, medications/allergies/relevant history reviewed, required imaging and test results available.   Sterile Technique Maximal sterile technique including sterile barrier drape, hand hygiene, sterile gown, sterile gloves, mask, hair covering.    Procedure Description Appropriate anatomy identified by palpation.  Patient's neck prepped and draped in sterile fashion.  1% lidocaine with epinephrine was used to anesthetize skin overlying neck.  1.5cm incision made and blunt dissection performed until tracheal rings could be easily palpated.   Then a size 6 Shiley tracheostomy was placed under bronchoscopic visualization using usual Seldinger technique and serial dilation.   Bronchoscope confirmed placement above the carina.  Tracheostomy was sutured in place with adhesive pad to protect skin under pressure.    Patient connected to ventilator.   Complications/Tolerance None; patient tolerated the procedure well. Chest X-ray is ordered to confirm no post-procedural complication.   EBL Minimal   Specimen(s) None

## 2024-05-29 NOTE — Progress Notes (Signed)
 NAME:  Akiera Allbaugh, MRN:  [id number], DOB:  04-11-1946, LOS: 3 ADMISSION DATE:  05/26/2024, CONSULTATION DATE:  05/26/24 REFERRING MD:  EDP CHIEF COMPLAINT:  AHRF, encephalopathy   History of Present Illness:  Patient is a 78 year old female with pertinent medical hx of ALS with complication of acute hypercarbic respiratory failure requiring tracheostomy, HTN, HLD. She had tracheostomy removed on Friday and became altered since Saturday 05/31.  She was brought to ED via EMS from [facility] and found to be hypothermic at 89.2 F, hypertensive and encephalopathic. Per RN, she was not following any commands. Her respiratory status was deteriorating and she was intubated by the EDP. Given hypothermia, sepsis protocol was initiated and she was started on Cefepime, Vancomycin.   I spoke with nursing staff and [facility] and they stated pt was decannulated on Friday and when her nurse found her this am, she was not responding and had agonal breathing. She would withdraw to pain per their report. She was also hypotensive at 80s/40s which improved after IVF. They reported at baseline pt was able to communicate and use walker to move.   After further discussion with the nursing staff at [facility].  They reported patient tolerated trach capping for the 2 days prior to decannulation and was refusing vent support at night which was the recommendation.  Given these findings, decision was made to decannulate her.  Lab work showed normal CBC, BMP with some electrolyte disturbances but normal creatinine. Normal hepatic fxn, normal ammonia.   Imaging was notable for normal CXR and normal CTH.   Pertinent  Medical History  ALS HTN HLD Atrial Tachycardia  Significant Hospital Events: Including procedures, antibiotic start and stop dates in addition to other pertinent events   06/02: presented to the ED with encephalopathy, respiratory failure. Intubated and transferred to the ICU  6/3 weaned off 2 pressors 6/4  pressors added back low dose, suspect sedation related  Interim History / Subjective:  On my exam, pt is intubated and sedated. She is not following any commands.  Unable to contact family, no answer when sister called on multiple attempts.  Objective    Blood pressure (!) 120/52, pulse 94, temperature 98.8 F (37.1 C), resp. rate 18, height 5\' 4"  (1.626 m), weight 45.5 kg, SpO2 100%.    Vent Mode: PRVC FiO2 (%):  [40 %] 40 % Set Rate:  [18 bmp] 18 bmp Vt Set:  [440 mL] 440 mL PEEP:  [5 cmH20] 5 cmH20 Plateau Pressure:  [16 cmH20-18 cmH20] 18 cmH20   Intake/Output Summary (Last 24 hours) at 05/29/2024 0814 Last data filed at 05/29/2024 0700 Gross per 24 hour  Intake 2928.25 ml  Output 1015 ml  Net 1913.25 ml   Filed Weights   05/27/24 0404 05/28/24 0403 05/29/24 0500  Weight: 42.2 kg 45.8 kg 45.5 kg    Examination: General: intubated and sedated HENT: intubated Lungs: CTAB, good air movement Cardiovascular: normal heart sounds, well perfused Abdomen: soft abdomen Extremities: no asymmetry Neuro: sedated, unable to assess GU: no foley  Labs   CBC: Recent Labs  Lab 05/26/24 0959 05/26/24 1054 05/26/24 1318 05/27/24 0525 05/27/24 0532 05/29/24 0634  WBC 5.8  --   --  7.1  --  9.9  NEUTROABS 5.3  --   --   --   --   --   HGB 14.3 13.3 11.2* 10.2* 10.2* 9.8*  HCT 44.5 39.0 33.0* 30.3* 30.0* 30.0*  MCV 92.5  --   --  89.4  --  91.7  PLT 261  --   --  170  --  140*   Basic Metabolic Panel: Recent Labs  Lab 05/26/24 0953 05/26/24 0959 05/26/24 1054 05/26/24 1318 05/27/24 0357 05/27/24 0532 05/27/24 0844 05/27/24 1721 05/28/24 0346 05/29/24 0634  NA 129*  130* 128*   < > 130* 134* 133*  --   --  134* 134*  K 4.2  4.3 3.9   < > 4.0 3.5 3.7  --   --  3.7 4.6  CL 87* 84*  --   --  96*  --   --   --  96* 96*  CO2  --  36*  --   --  30  --   --   --  31 31  GLUCOSE 138* 134*  --   --  101*  --   --   --  127* 130*  BUN 34* 24*  --   --  24*  --   --   --  21  16  CREATININE 0.60 0.33*  --   --  0.44  --   --   --  0.32* 0.40*  CALCIUM  --  9.8  --   --  8.8*  --   --   --  8.3* 8.9  MG  --   --   --   --  1.3*  --   --   --  1.9  --   PHOS  --   --   --   --  1.9*  --  2.1* 3.4 3.7 2.9   < > = values in this interval not displayed.   GFR: Estimated Creatinine Clearance: 41.6 mL/min (A) (by C-G formula based on SCr of 0.4 mg/dL (L)). Recent Labs  Lab 05/26/24 0954 05/26/24 0959 05/27/24 0525 05/29/24 0634  PROCALCITON  --  <0.10  --   --   WBC  --  5.8 7.1 9.9  LATICACIDVEN 0.9  --   --   --    Liver Function Tests: Recent Labs  Lab 05/26/24 0959  AST 25  ALT 31  ALKPHOS 85  BILITOT 0.4  PROT 7.3  ALBUMIN 3.7   No results for input(s): "LIPASE", "AMYLASE" in the last 168 hours. Recent Labs  Lab 05/26/24 0959  AMMONIA 24   ABG    Component Value Date/Time   PHART 7.473 (H) 05/27/2024 0532   PCO2ART 41.5 05/27/2024 0532   PO2ART 165 (H) 05/27/2024 0532   HCO3 30.3 (H) 05/27/2024 0532   TCO2 32 05/27/2024 0532   O2SAT 100 05/27/2024 0532    Coagulation Profile: No results for input(s): "INR", "PROTIME" in the last 168 hours. Cardiac Enzymes: Recent Labs  Lab 05/26/24 0959  CKTOTAL 44   HbA1C: Hgb A1c MFr Bld  Date/Time Value Ref Range Status  05/26/2024 04:31 PM 4.9 4.8 - 5.6 % Final    Comment:    (NOTE) Diagnosis of Diabetes The following HbA1c ranges recommended by the American Diabetes Association (ADA) may be used as an aid in the diagnosis of diabetes mellitus.  Hemoglobin             Suggested A1C NGSP%              Diagnosis  <5.7                   Non Diabetic  5.7-6.4  Pre-Diabetic  >6.4                   Diabetic  <7.0                   Glycemic control for                       adults with diabetes.     CBG: Recent Labs  Lab 05/28/24 1644 05/28/24 1918 05/28/24 2346 05/29/24 0352 05/29/24 0742  GLUCAP 127* 112* 129* 125* 120*    Consults   Assessment & Plan:    Acute on Chronic hypercarbic respiratory failure: In the setting of neuromuscular weakness related to ALS.  This is a progressive illness.  Unfortunately, she had decompensation after tracheostomy was removed.  I suspect if goals of care are consistent with prior goals of care, she will require redo tracheostomy for ongoing care moving forward. -- Chest x-ray is clear -- PRVC, stress ulcer prophylaxis, VAP bundle -- SBT/SAT as able, plan re-do trach   Hypotension: High suspicion for hypovolemia driving as well as related to sedation.  Occult sepsis considered but no clear source, chest x-ray clear, UA okay. BP gradually improved pressor requirement improving overall with fluids. 4L crystalloid given via ED and in unit first day of admission. Smoldering low dose pressors felt to be related to sedation. -- NE, MAP goal 65 -- Antibiotics d/c'd after negative cultures at 48 hrs --Midodrine started 6/4, increased 6/5   Hyponatremia: With hyperchloremia, points to volume contraction, hypovolemia. Improving with volume resuscitation. --TF   ALS: Diagnosed via EMG, bulbar predominant.  This is a progressive disorder. -- Goals of care with sister when able, plan to replace trach once stabilized, suspect will require lifelong trach   Failure to thrive in adult, severe protein calorie malnutrition: --TF  Hypomagnesemia and hypophosphatemia --replace   Best Practice (right click and "Reselect all SmartList Selections" daily)  Diet/type: TF DVT prophylaxis: LMWH GI prophylaxis: PPI Lines: N/A Foley:  N/A Continuous: Fentanyl, versed  Code Status:  full code Last date of multidisciplinary goals of care discussion [have attempted on multiple occasions to contact designated decision-maker, sister, listed in chart, no answer, no option for voicemail]  Home Medications  Prior to Admission medications   Medication Sig Start Date End Date Taking? Authorizing Provider  CARDIZEM LA 120 MG 24 hr  tablet  04/26/18   [provider]  latanoprost (XALATAN) 0.005 % ophthalmic solution Place 1 drop into both eyes at bedtime.    [provider]  Olmesartan-Amlodipine-HCTZ (TRIBENZOR) 20-5-12.5 MG TABS Take by mouth daily.    [provider]    Allergies Allergies  Allergen Reactions   Iodine Anaphylaxis   Shellfish Allergy Anaphylaxis    Due to iodine content   Zymaxid [Gatifloxacin] Other (See Comments)    Weakness to legs causing frequent falls; inability to stand or walk   Amlodipine Swelling    Leg and ankle swelling    Aleve [Naproxen] Swelling   Penicillins Other (See Comments)    Fever and rash Tolerated cephalosporins    Adoxa [Doxycycline] Other (See Comments)    Dizziness    Ak-Mycin [Erythromycin] Nausea Only   Benadryl Itch Stopping [Diphenhydramine-Zinc Acetate] Rash    Blistering rash Reaction to topical diphenhydramine    Codeine Other (See Comments)    Unknown reaction    CRITICAL CARE Performed by: Archer Kobs Orilla Templeman   Total critical care time: 31 minutes  Critical care time was exclusive of separately billable procedures and treating other patients.  Critical care was necessary to treat or prevent imminent or life-threatening deterioration.  Critical care was time spent personally by me on the following activities: development of treatment plan with patient and/or surrogate as well as nursing, discussions with consultants, evaluation of patient's response to treatment, examination of patient, obtaining history from patient or surrogate, ordering and performing treatments and interventions, ordering and review of laboratory studies, ordering and review of radiographic studies, pulse oximetry and re-evaluation of patient's condition.   Guerry Leek, MD [facility]

## 2024-05-29 NOTE — TOC Progression Note (Signed)
 Transition of Care [facility]) - Progression Note    Patient Details  Name: Dominique Brewer MRN: [id number] Date of Birth: 02/09/1946  [facility]) CM/SW Contact  Jeani Mill, RN Phone Number: 05/29/2024, 2:21 PM  Clinical Narrative:    Spoke to patient's sister, [name], regarding transfer to [facility] tomorrow. [name] is agreeable to transfer tomorrow.  This RNCM will notified [name] tomorrow of transfer time.  DJ with [facility] will send bed information.    Expected Discharge Plan: Skilled Nursing Facility Barriers to Discharge: Continued Medical Work up  Expected Discharge Plan and Services In-house Referral: Clinical Social Work   Post Acute Care Choice: Skilled Nursing Facility Living arrangements for the past 2 months: Skilled Nursing Facility                                       Social Determinants of Health (SDOH) Interventions SDOH Screenings   Food Insecurity: Low Risk  (03/07/2024)   Received from [facility]  Housing: Low Risk  (03/07/2024)   Received from [facility]  Transportation Needs: No Transportation Needs (03/07/2024)   Received from [facility]  Utilities: Low Risk  (03/07/2024)   Received from [facility]  Financial Resource Strain: High Risk (06/08/2023)   Received from [facility]  Physical Activity: Unknown (12/08/2022)   Received from [facility]  Social Connections: Socially Isolated (12/08/2022)   Received from [facility]  Stress: No Stress Concern Present (12/08/2022)   Received from [facility]  Tobacco Use: Low Risk  (05/26/2024)    Readmission Risk Interventions     No data to display

## 2024-05-30 DIAGNOSIS — Z930 Tracheostomy status: Secondary | ICD-10-CM

## 2024-05-30 DIAGNOSIS — G1221 Amyotrophic lateral sclerosis: Secondary | ICD-10-CM | POA: Insufficient documentation

## 2024-05-30 DIAGNOSIS — Z9911 Dependence on respirator [ventilator] status: Secondary | ICD-10-CM

## 2024-05-30 DIAGNOSIS — L8990 Pressure ulcer of unspecified site, unspecified stage: Secondary | ICD-10-CM | POA: Insufficient documentation

## 2024-05-30 DIAGNOSIS — I952 Hypotension due to drugs: Secondary | ICD-10-CM | POA: Insufficient documentation

## 2024-05-30 DIAGNOSIS — E878 Other disorders of electrolyte and fluid balance, not elsewhere classified: Secondary | ICD-10-CM | POA: Insufficient documentation

## 2024-05-30 LAB — GLUCOSE, CAPILLARY
Glucose-Capillary: 129 mg/dL — ABNORMAL HIGH (ref 70–99)
Glucose-Capillary: 143 mg/dL — ABNORMAL HIGH (ref 70–99)
Glucose-Capillary: 142 mg/dL — ABNORMAL HIGH (ref 70–99)

## 2024-05-30 MED ORDER — MIDODRINE HCL 5 MG PO TABS: 15.0000 mg | ORAL_TABLET | Freq: Three times a day (TID) | ORAL | Status: AC

## 2024-05-30 MED ORDER — ENOXAPARIN SODIUM 30 MG/0.3ML IJ SOSY: 30.0000 mg | PREFILLED_SYRINGE | INTRAMUSCULAR | Status: AC

## 2024-05-30 MED ORDER — DOCUSATE SODIUM 50 MG/5ML PO LIQD: 100.0000 mg | Freq: Two times a day (BID) | ORAL | Status: DC

## 2024-05-30 NOTE — TOC Progression Note (Signed)
 Transition of Care [facility]) - Progression Note    Patient Details  Name: Dominique Brewer MRN: [id number] Date of Birth: [birth year]  Transition of Care [facility]) CM/SW Contact  Jeani Mill, RN Phone Number: 05/30/2024, 9:10 AM  Clinical Narrative:    Patient has bed at [facility]. room 415, Nurse report [phone]. MD handoff Dr. Delpha Fickle [phone],    Expected Discharge Plan: Skilled Nursing Facility Barriers to Discharge: Continued Medical Work up  Expected Discharge Plan and Services In-house Referral: Clinical Social Work   Post Acute Care Choice: Skilled Nursing Facility Living arrangements for the past 2 months: Skilled Nursing Facility                                       Social Determinants of Health (SDOH) Interventions SDOH Screenings   Food Insecurity: Low Risk  (03/07/2024)   Received from [facility]  Housing: Low Risk  (03/07/2024)   Received from [facility]  Transportation Needs: No Transportation Needs (03/07/2024)   Received from [facility]  Utilities: Low Risk  (03/07/2024)   Received from [facility]  Financial Resource Strain: High Risk (06/08/2023)   Received from [facility]  Physical Activity: Unknown (12/08/2022)   Received from [facility]  Social Connections: Socially Isolated (12/08/2022)   Received from [facility]  Stress: No Stress Concern Present (12/08/2022)   Received from [facility]  Tobacco Use: Low Risk  (05/26/2024)    Readmission Risk Interventions     No data to display

## 2024-05-30 NOTE — Progress Notes (Signed)
 SLP Cancellation Note  Patient Details Name: Dominique Brewer MRN: [id number] DOB: 26-Feb-1946   Cancelled treatment:       Reason Eval/Treat Not Completed: Patient not medically ready Patient with new tracheostomy. Orders for SLP eval and treat for PMSV and swallowing received. Will follow pt closely for readiness for SLP interventions as appropriate.     Duwan Adrian, Hardin Leys 05/30/2024, 10:31 AM

## 2024-05-30 NOTE — TOC Transition Note (Signed)
 Transition of Care [facility]) - Discharge Note   Patient Details  Name: Dominique Brewer MRN: [id number] Date of Birth: 1946-04-29  Transition of Care [facility]) CM/SW Contact:  Jeani Mill, RN Phone Number: 05/30/2024, 1:13 PM   Clinical Narrative:    Patient stable for transfer to [facility] room 415. [name], sister is aware.  Bedside RN will call [facility] for transportation.     Final next level of care: Long Term Acute Care (LTAC) Barriers to Discharge: Barriers Resolved   Patient Goals and CMS Choice Patient states their goals for this hospitalization and ongoing recovery are:: Sister wants her to go to [employer].gov Compare Post Acute Care list provided to:: Patient Represenative (must comment) Choice offered to / list presented to : Sibling      Discharge Placement  [facility]                     Discharge Plan and Services Additional resources added to the After Visit Summary for   In-house Referral: Clinical Social Work   Post Acute Care Choice: Skilled Nursing Facility                               Social Drivers of Health (SDOH) Interventions SDOH Screenings   Food Insecurity: Low Risk  (03/07/2024)   Received from [facility]  Housing: Low Risk  (03/07/2024)   Received from [facility]  Transportation Needs: No Transportation Needs (03/07/2024)   Received from [facility]  Utilities: Low Risk  (03/07/2024)   Received from [facility]  Financial Resource Strain: High Risk (06/08/2023)   Received from [facility]  Physical Activity: Unknown (12/08/2022)   Received from [facility]  Social Connections: Socially Isolated (12/08/2022)   Received from [facility]  Stress: No Stress Concern Present (12/08/2022)   Received from [facility]  Tobacco Use: Low Risk  (05/26/2024)     Readmission Risk Interventions     No data to display

## 2024-05-30 NOTE — Discharge Summary (Signed)
 Physician Discharge Summary         Patient ID: Dominique Brewer MRN: [id number] DOB/AGE: 07-18-1946 78 y.o.  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   Ventilator dependence (HCC) 05/30/2024    Priority: 1.   Hypotension due to drugs 05/30/2024   Pressure injury of skin 05/30/2024   ALS (amyotrophic lateral sclerosis) (HCC) 05/30/2024   Alteration in electrolyte and fluid balance 05/30/2024   Protein-calorie malnutrition, severe 05/28/2024    Resolved Hospital Problems   Diagnosis Date Noted Date Resolved   Acute on chronic respiratory failure with hypoxia and hypercapnia (HCC) 05/26/2024 05/30/2024      Discharge summary    78 year old female with pertinent medical hx of ALS with complication of acute hypercarbic respiratory failure requiring tracheostomy, HTN, HLD. She had tracheostomy removed on 5/30 and became altered since Saturday 05/31.   She was brought to ED via EMS from [facility] and found to be hypothermic at 89.2 F, hypertensive and encephalopathic. Per RN, she was not following any commands. Her respiratory status was deteriorating and she was intubated by the EDP. Given hypothermia, sepsis protocol was initiated and she was started on Cefepime, Vancomycin.    Per  [facility] and they stated pt was decannulated on Friday and when her nurse found her this am, she was not responding and had agonal breathing. She would withdraw to pain per their report. She was also hypotensive at 80s/40s which improved after IVF. They reported at baseline pt was able to communicate and use walker to move.    After further discussion with the nursing staff at [facility].  They reported patient tolerated trach capping for the 2 days prior to decannulation and was refusing vent support at night which was the recommendation.  Given these findings, decision was  made to decannulate her.  Hospital course  6/3 post intubation admitted to ICU. Supported w/ mechanical ventilation, IVFs, pressors and antibiotics. Successfully weaned off pressors over course of day 6/4 back on low dose pressor felt sedation related 6/5 trach placed. Communicated w/ family trach will be life long given progressive neuromuscular disease. All antibiotics stopped. Neg cultures.  6/6 ready for xfer back to [facility]    Discharge Plan by Active Problems    Acute on Chronic hypercarbic respiratory failure, now tracheostomy and vent dependent : In the setting of neuromuscular weakness related to ALS.  This is a progressive illness.  Unfortunately, she had decompensation after tracheostomy was removed.  I suspect if goals of care are consistent with prior goals of care, Plan Cont routine trach care  Remove sutures on 6/12 Resume Weaning per [facility] protocol note given the nature of her illness likely to require at least nocturnal ventilation and expect need for ventilation to progress over time  VAP bundle  Hypotension: High suspicion for hypovolemia driving as well as related to sedation.  Occult sepsis considered but no clear source, chest x-ray clear, UA okay. BP gradually improved pressor requirement improving overall with fluids. 4L crystalloid given via ED and in unit first day of admission. Smoldering low dose pressors felt to be related to sedation. Now off norepi Plan Continue midodrine as needed  Hyponatremia: With hyperchloremia, points to volume contraction, hypovolemia. Improving with volume resuscitation. Plan Cont TF   ALS: Diagnosed via EMG, bulbar predominant.  This is a progressive disorder. Plan Supportive care  Failure to thrive in adult, severe protein calorie malnutrition: Plan  tubefeeds     Discharge Exam: BP 133/76   Pulse 88   Temp 99 F (37.2 C)   Resp 18   Ht 5\' 4"  (1.626 m)   Wt 43.6 kg   SpO2 100%   BMI 16.50 kg/m   General frail  78 year old female. Laying in bed, now on full support but no distress HENT NCAT w/ exception of temporal wasting. # 6 cuffed shiley midline. No cuff leak minimal discharge Pulm clear Card rrr Abd soft not tender PEG in place Ext warm dry no edema Neuro awake moves all ext. Interactive. No focal weakness w/ exception of baseline def  Labs at discharge   Lab Results  Component Value Date   CREATININE 0.40 (L) 05/29/2024   BUN 16 05/29/2024   NA 134 (L) 05/29/2024   K 4.6 05/29/2024   CL 96 (L) 05/29/2024   CO2 31 05/29/2024   Lab Results  Component Value Date   WBC 9.9 05/29/2024   HGB 9.8 (L) 05/29/2024   HCT 30.0 (L) 05/29/2024   MCV 91.7 05/29/2024   PLT 140 (L) 05/29/2024   Lab Results  Component Value Date   ALT 31 05/26/2024   AST 25 05/26/2024   ALKPHOS 85 05/26/2024   BILITOT 0.4 05/26/2024   No results found for: "INR", "PROTIME"  Current radiological studies    DG Chest Port 1 View Result Date: 05/29/2024 CLINICAL DATA:  Status post tracheostomy EXAM: PORTABLE CHEST 1 VIEW COMPARISON:  Chest radiograph dated 05/26/2024 FINDINGS: Lines/tubes: Tracheostomy tube tip projects over the mid intrathoracic trachea. Right upper extremity PICC tip projects over the superior cavoatrial junction. Partially imaged percutaneous gastrostomy tube projects over the left upper quadrant. Lungs: Patient is rotated to the right. Confluent bilateral lower lung opacities, right-greater-than-left. Pleura: Blunting of the right costophrenic angle.  No pneumothorax. Heart/mediastinum: Right heart border is obscured. Bones: No acute osseous abnormality. IMPRESSION: 1. Tracheostomy tube tip projects over the mid intrathoracic trachea. 2. Confluent bilateral lower lung opacities, right-greater-than-left, which may represent atelectasis, aspiration, or pneumonia. 3. Small right pleural effusion. Electronically Signed   By: Limin  Xu M.D.   On: 05/29/2024 16:33    Disposition:    Discharge  disposition: 01-Home or Self Care       Discharge Instructions     Change dressing (specify)   Complete by: As directed    Wound care  Daily      Comments: Cleanse sacrum and buttocks with Vashe wound cleanser Timm Foot [phone]) do not rinse and allow to air dry. Apply Xeroform gauze Timm Foot [phone] to wound bed daily, cover with dry gauze and silicone foam or ABD pad whichever is preferred   Diet - low sodium heart healthy   Complete by: As directed    Increase activity slowly   Complete by: As directed        Allergies as of 05/30/2024       Reactions   Iodine Anaphylaxis   Shellfish Allergy Anaphylaxis   Due to iodine content   Zymaxid [gatifloxacin] Other (See Comments)   Weakness to legs causing frequent falls; inability to stand or walk   Amlodipine Swelling   Leg and ankle swelling   Aleve [naproxen] Swelling   Penicillins Other (See Comments)   Fever and rash Tolerated cephalosporins    Adoxa [doxycycline] Other (See Comments)   Dizziness   Ak-mycin [erythromycin] Nausea Only   Benadryl Itch Stopping [diphenhydramine-zinc  Acetate] Rash   Blistering rash Reaction to topical diphenhydramine   Codeine Other (See Comments)   Unknown reaction        Medication List     STOP taking these medications    EPINEPHrine 1 MG/ML Soln injection Commonly known as: ADRENALIN   verapamil 180 MG CR tablet Commonly known as: CALAN-SR       TAKE these medications    acetaminophen 325 MG tablet Commonly known as: TYLENOL Place 650 mg into feeding tube every 6 (six) hours as needed (pain).   Calmoseptine 0.44-20.6 % Oint Generic drug: Menthol-Zinc Oxide Apply 1 application  topically See admin instructions. Apply a thin layer topically to sacral region for incontinence episodes, three times daily on every shift.   carboxymethylcellulose 0.5 % Soln Commonly known as: REFRESH PLUS Place 1 drop into both eyes every 6 (six) hours as needed (dry eye).   docusate 50  MG/5ML liquid Commonly known as: COLACE Place 10 mLs (100 mg total) into feeding tube 2 (two) times daily.   enoxaparin 30 MG/0.3ML injection Commonly known as: LOVENOX Inject 0.3 mLs (30 mg total) into the skin daily. What changed:  medication strength how much to take when to take this   feeding supplement (OSMOLITE 1.5 CAL) Liqd Place 1,430-1,560 mLs into feeding tube continuous. May be off for up to 2 hours daily for ADL's and activities. Total for day 1430-157mL's (4098-1191 kCal)   guaiFENesin 200 MG tablet Place 200 mg into feeding tube every 6 (six) hours as needed for cough or to loosen phlegm.   lansoprazole 30 MG disintegrating tablet Commonly known as: PREVACID SOLUTAB Take 30 mg by mouth daily.   latanoprost 0.005 % ophthalmic solution Commonly known as: XALATAN Place 1 drop into both eyes at bedtime.   melatonin 1 MG Tabs tablet Take 1 mg by mouth at bedtime as needed (sleep).   midodrine 5 MG tablet Commonly known as: PROAMATINE Place 3 tablets (15 mg total) into feeding tube every 8 (eight) hours.   multivitamin tablet Place 1 tablet into feeding tube daily.   thiamine 100 MG tablet Commonly known as: VITAMIN B1 Place 100 mg into feeding tube daily.   Zinc Sulfate 220 (50 Zn) MG Tabs Give 1 tablet by tube daily.               Discharge Care Instructions  (From admission, onward)           Start     Ordered   05/30/24 0000  Change dressing (specify)       Comments: Wound care  Daily      Comments: Cleanse sacrum and buttocks with Vashe wound cleanser Timm Foot [phone]) do not rinse and allow to air dry. Apply Xeroform gauze ([place] [phone]) to wound bed daily, cover with dry gauze and silicone foam or ABD pad whichever is preferred   05/30/24 1304             Follow-up appointment   To be determined Discharge Condition:    stable  Physician Statement:   The Patient was personally examined, the discharge assessment and plan has  been personally reviewed and I agree with ACNP Brookie Wayment's assessment and plan. 37 minutes of time have been dedicated to discharge assessment, planning and discharge instructions.   Signed: Armstead Bertrand 05/30/2024, 1:04 PM

## 2024-05-30 NOTE — Plan of Care (Signed)
  Problem: Clinical Measurements: Goal: Respiratory complications will improve Outcome: Progressing Goal: Cardiovascular complication will be avoided Outcome: Progressing   Problem: Nutrition: Goal: Adequate nutrition will be maintained Outcome: Progressing   Problem: Coping: Goal: Level of anxiety will decrease Outcome: Progressing   Problem: Elimination: Goal: Will not experience complications related to urinary retention Outcome: Progressing

## 2024-05-30 NOTE — Progress Notes (Signed)
 Patient transported off unit via [facility] to [facility].

## 2024-05-30 NOTE — Progress Notes (Signed)
 PT Cancellation Note  Patient Details Name: Dominique Brewer MRN: [id number] DOB: 05-18-1946   Cancelled Treatment:    Reason Eval/Treat Not Completed: Other (comment) (received automatic post trach order, pt to return to LTAC today and acute eval not warranted, will sign off. please reorder if pt plan changes)   Tico Crotteau B Stefania Goulart 05/30/2024, 10:32 AM Annis Baseman, PT Acute Rehabilitation Services Office: [phone]

## 2024-05-31 DIAGNOSIS — R5381 Other malaise: Secondary | ICD-10-CM

## 2024-05-31 DIAGNOSIS — J189 Pneumonia, unspecified organism: Secondary | ICD-10-CM

## 2024-05-31 DIAGNOSIS — J9602 Acute respiratory failure with hypercapnia: Secondary | ICD-10-CM

## 2024-05-31 DIAGNOSIS — G1221 Amyotrophic lateral sclerosis: Secondary | ICD-10-CM

## 2024-05-31 DIAGNOSIS — L98429 Non-pressure chronic ulcer of back with unspecified severity: Secondary | ICD-10-CM

## 2024-05-31 LAB — CULTURE, BLOOD (ROUTINE X 2)
Culture: NO GROWTH
Culture: NO GROWTH
Special Requests: ADEQUATE

## 2024-06-02 DIAGNOSIS — J189 Pneumonia, unspecified organism: Secondary | ICD-10-CM

## 2024-06-02 DIAGNOSIS — L98429 Non-pressure chronic ulcer of back with unspecified severity: Secondary | ICD-10-CM

## 2024-06-02 DIAGNOSIS — G1221 Amyotrophic lateral sclerosis: Secondary | ICD-10-CM

## 2024-06-02 DIAGNOSIS — J9621 Acute and chronic respiratory failure with hypoxia: Secondary | ICD-10-CM

## 2024-06-02 DIAGNOSIS — I4891 Unspecified atrial fibrillation: Secondary | ICD-10-CM

## 2024-06-02 DIAGNOSIS — Z930 Tracheostomy status: Secondary | ICD-10-CM

## 2024-06-02 DIAGNOSIS — J9602 Acute respiratory failure with hypercapnia: Secondary | ICD-10-CM

## 2024-06-02 DIAGNOSIS — R5381 Other malaise: Secondary | ICD-10-CM

## 2024-06-03 DIAGNOSIS — J9621 Acute and chronic respiratory failure with hypoxia: Secondary | ICD-10-CM

## 2024-06-03 DIAGNOSIS — Z930 Tracheostomy status: Secondary | ICD-10-CM

## 2024-06-03 DIAGNOSIS — J189 Pneumonia, unspecified organism: Secondary | ICD-10-CM

## 2024-06-03 DIAGNOSIS — I4891 Unspecified atrial fibrillation: Secondary | ICD-10-CM

## 2024-06-04 DIAGNOSIS — I4891 Unspecified atrial fibrillation: Secondary | ICD-10-CM

## 2024-06-04 DIAGNOSIS — Z930 Tracheostomy status: Secondary | ICD-10-CM

## 2024-06-04 DIAGNOSIS — J189 Pneumonia, unspecified organism: Secondary | ICD-10-CM

## 2024-06-04 DIAGNOSIS — J9621 Acute and chronic respiratory failure with hypoxia: Secondary | ICD-10-CM

## 2024-06-05 DIAGNOSIS — I4891 Unspecified atrial fibrillation: Secondary | ICD-10-CM

## 2024-06-05 DIAGNOSIS — J9621 Acute and chronic respiratory failure with hypoxia: Secondary | ICD-10-CM

## 2024-06-05 DIAGNOSIS — Z930 Tracheostomy status: Secondary | ICD-10-CM

## 2024-06-05 DIAGNOSIS — J189 Pneumonia, unspecified organism: Secondary | ICD-10-CM

## 2024-06-06 DIAGNOSIS — J189 Pneumonia, unspecified organism: Secondary | ICD-10-CM

## 2024-06-06 DIAGNOSIS — J9621 Acute and chronic respiratory failure with hypoxia: Secondary | ICD-10-CM

## 2024-06-06 DIAGNOSIS — Z930 Tracheostomy status: Secondary | ICD-10-CM

## 2024-06-06 DIAGNOSIS — I4891 Unspecified atrial fibrillation: Secondary | ICD-10-CM

## 2024-06-07 DIAGNOSIS — R5381 Other malaise: Secondary | ICD-10-CM

## 2024-06-07 DIAGNOSIS — Z930 Tracheostomy status: Secondary | ICD-10-CM

## 2024-06-07 DIAGNOSIS — J9621 Acute and chronic respiratory failure with hypoxia: Secondary | ICD-10-CM

## 2024-06-07 DIAGNOSIS — L98429 Non-pressure chronic ulcer of back with unspecified severity: Secondary | ICD-10-CM

## 2024-06-07 DIAGNOSIS — J189 Pneumonia, unspecified organism: Secondary | ICD-10-CM

## 2024-06-07 DIAGNOSIS — J9602 Acute respiratory failure with hypercapnia: Secondary | ICD-10-CM

## 2024-06-07 DIAGNOSIS — I4891 Unspecified atrial fibrillation: Secondary | ICD-10-CM

## 2024-06-07 DIAGNOSIS — G1221 Amyotrophic lateral sclerosis: Secondary | ICD-10-CM

## 2024-06-08 DIAGNOSIS — J189 Pneumonia, unspecified organism: Secondary | ICD-10-CM

## 2024-06-08 DIAGNOSIS — Z930 Tracheostomy status: Secondary | ICD-10-CM

## 2024-06-08 DIAGNOSIS — I4891 Unspecified atrial fibrillation: Secondary | ICD-10-CM

## 2024-06-08 DIAGNOSIS — J9621 Acute and chronic respiratory failure with hypoxia: Secondary | ICD-10-CM

## 2024-06-09 DIAGNOSIS — R5381 Other malaise: Secondary | ICD-10-CM

## 2024-06-09 DIAGNOSIS — L98429 Non-pressure chronic ulcer of back with unspecified severity: Secondary | ICD-10-CM

## 2024-06-09 DIAGNOSIS — J9602 Acute respiratory failure with hypercapnia: Secondary | ICD-10-CM

## 2024-06-09 DIAGNOSIS — J189 Pneumonia, unspecified organism: Secondary | ICD-10-CM

## 2024-06-09 DIAGNOSIS — G1221 Amyotrophic lateral sclerosis: Secondary | ICD-10-CM

## 2024-06-16 DIAGNOSIS — Z930 Tracheostomy status: Secondary | ICD-10-CM

## 2024-06-16 DIAGNOSIS — J9621 Acute and chronic respiratory failure with hypoxia: Secondary | ICD-10-CM

## 2024-06-16 DIAGNOSIS — I4891 Unspecified atrial fibrillation: Secondary | ICD-10-CM

## 2024-06-16 DIAGNOSIS — J189 Pneumonia, unspecified organism: Secondary | ICD-10-CM

## 2024-06-17 DIAGNOSIS — I4891 Unspecified atrial fibrillation: Secondary | ICD-10-CM

## 2024-06-17 DIAGNOSIS — J189 Pneumonia, unspecified organism: Secondary | ICD-10-CM

## 2024-06-17 DIAGNOSIS — J9621 Acute and chronic respiratory failure with hypoxia: Secondary | ICD-10-CM

## 2024-06-17 DIAGNOSIS — Z930 Tracheostomy status: Secondary | ICD-10-CM

## 2024-06-18 DIAGNOSIS — J9621 Acute and chronic respiratory failure with hypoxia: Secondary | ICD-10-CM

## 2024-06-18 DIAGNOSIS — J189 Pneumonia, unspecified organism: Secondary | ICD-10-CM

## 2024-06-18 DIAGNOSIS — Z930 Tracheostomy status: Secondary | ICD-10-CM

## 2024-06-18 DIAGNOSIS — I4891 Unspecified atrial fibrillation: Secondary | ICD-10-CM

## 2024-06-19 DIAGNOSIS — Z930 Tracheostomy status: Secondary | ICD-10-CM

## 2024-06-19 DIAGNOSIS — J189 Pneumonia, unspecified organism: Secondary | ICD-10-CM

## 2024-06-19 DIAGNOSIS — I4891 Unspecified atrial fibrillation: Secondary | ICD-10-CM

## 2024-06-19 DIAGNOSIS — J9621 Acute and chronic respiratory failure with hypoxia: Secondary | ICD-10-CM

## 2024-06-20 DIAGNOSIS — J189 Pneumonia, unspecified organism: Secondary | ICD-10-CM

## 2024-06-20 DIAGNOSIS — Z930 Tracheostomy status: Secondary | ICD-10-CM

## 2024-06-20 DIAGNOSIS — I4891 Unspecified atrial fibrillation: Secondary | ICD-10-CM

## 2024-06-20 DIAGNOSIS — J9621 Acute and chronic respiratory failure with hypoxia: Secondary | ICD-10-CM

## 2024-06-21 DIAGNOSIS — Z930 Tracheostomy status: Secondary | ICD-10-CM

## 2024-06-21 DIAGNOSIS — J189 Pneumonia, unspecified organism: Secondary | ICD-10-CM

## 2024-06-21 DIAGNOSIS — J9602 Acute respiratory failure with hypercapnia: Secondary | ICD-10-CM

## 2024-06-21 DIAGNOSIS — L98429 Non-pressure chronic ulcer of back with unspecified severity: Secondary | ICD-10-CM

## 2024-06-21 DIAGNOSIS — J9621 Acute and chronic respiratory failure with hypoxia: Secondary | ICD-10-CM

## 2024-06-21 DIAGNOSIS — I4891 Unspecified atrial fibrillation: Secondary | ICD-10-CM

## 2024-06-21 DIAGNOSIS — G1221 Amyotrophic lateral sclerosis: Secondary | ICD-10-CM

## 2024-06-21 DIAGNOSIS — R5381 Other malaise: Secondary | ICD-10-CM

## 2024-06-22 DIAGNOSIS — Z930 Tracheostomy status: Secondary | ICD-10-CM

## 2024-06-22 DIAGNOSIS — I4891 Unspecified atrial fibrillation: Secondary | ICD-10-CM

## 2024-06-22 DIAGNOSIS — J9621 Acute and chronic respiratory failure with hypoxia: Secondary | ICD-10-CM

## 2024-06-22 DIAGNOSIS — J189 Pneumonia, unspecified organism: Secondary | ICD-10-CM

## 2024-06-23 DIAGNOSIS — J189 Pneumonia, unspecified organism: Secondary | ICD-10-CM

## 2024-06-23 DIAGNOSIS — R5381 Other malaise: Secondary | ICD-10-CM

## 2024-06-23 DIAGNOSIS — J9602 Acute respiratory failure with hypercapnia: Secondary | ICD-10-CM

## 2024-06-23 DIAGNOSIS — G1221 Amyotrophic lateral sclerosis: Secondary | ICD-10-CM

## 2024-06-23 DIAGNOSIS — L98429 Non-pressure chronic ulcer of back with unspecified severity: Secondary | ICD-10-CM

## 2024-06-30 DIAGNOSIS — Z930 Tracheostomy status: Secondary | ICD-10-CM

## 2024-06-30 DIAGNOSIS — J9602 Acute respiratory failure with hypercapnia: Secondary | ICD-10-CM

## 2024-06-30 DIAGNOSIS — R5381 Other malaise: Secondary | ICD-10-CM

## 2024-06-30 DIAGNOSIS — G1221 Amyotrophic lateral sclerosis: Secondary | ICD-10-CM

## 2024-06-30 DIAGNOSIS — L98429 Non-pressure chronic ulcer of back with unspecified severity: Secondary | ICD-10-CM

## 2024-06-30 DIAGNOSIS — J9621 Acute and chronic respiratory failure with hypoxia: Secondary | ICD-10-CM

## 2024-06-30 DIAGNOSIS — J189 Pneumonia, unspecified organism: Secondary | ICD-10-CM

## 2024-06-30 DIAGNOSIS — I4891 Unspecified atrial fibrillation: Secondary | ICD-10-CM

## 2024-07-01 DIAGNOSIS — E43 Unspecified severe protein-calorie malnutrition: Secondary | ICD-10-CM | POA: Diagnosis not present

## 2024-07-01 DIAGNOSIS — R627 Adult failure to thrive: Secondary | ICD-10-CM | POA: Diagnosis not present

## 2024-07-01 DIAGNOSIS — R5381 Other malaise: Secondary | ICD-10-CM | POA: Diagnosis not present

## 2024-07-01 DIAGNOSIS — J9602 Acute respiratory failure with hypercapnia: Secondary | ICD-10-CM | POA: Diagnosis not present

## 2024-07-01 DIAGNOSIS — G1221 Amyotrophic lateral sclerosis: Secondary | ICD-10-CM

## 2024-07-06 ENCOUNTER — Other Ambulatory Visit: Payer: Self-pay

## 2024-07-06 ENCOUNTER — Emergency Department (HOSPITAL_COMMUNITY)

## 2024-07-06 ENCOUNTER — Encounter (HOSPITAL_COMMUNITY): Payer: Self-pay | Admitting: Emergency Medicine

## 2024-07-06 ENCOUNTER — Inpatient Hospital Stay (HOSPITAL_COMMUNITY)
Admission: EM | Admit: 2024-07-06 | Discharge: 2024-07-14 | DRG: 871 | Disposition: A | Discharge status: Skilled Nursing Facility | Source: Other Acute Inpatient Hospital | Attending: Internal Medicine | Admitting: Internal Medicine

## 2024-07-06 DIAGNOSIS — R6520 Severe sepsis without septic shock: Principal | ICD-10-CM

## 2024-07-06 DIAGNOSIS — F32A Depression, unspecified: Secondary | ICD-10-CM | POA: Diagnosis present

## 2024-07-06 DIAGNOSIS — J189 Pneumonia, unspecified organism: Secondary | ICD-10-CM | POA: Diagnosis not present

## 2024-07-06 DIAGNOSIS — I4891 Unspecified atrial fibrillation: Secondary | ICD-10-CM

## 2024-07-06 DIAGNOSIS — R6521 Severe sepsis with septic shock: Secondary | ICD-10-CM | POA: Diagnosis present

## 2024-07-06 DIAGNOSIS — J95851 Ventilator associated pneumonia: Secondary | ICD-10-CM | POA: Diagnosis present

## 2024-07-06 DIAGNOSIS — J9801 Acute bronchospasm: Secondary | ICD-10-CM | POA: Diagnosis present

## 2024-07-06 DIAGNOSIS — Z888 Allergy status to other drugs, medicaments and biological substances status: Secondary | ICD-10-CM

## 2024-07-06 DIAGNOSIS — I2510 Atherosclerotic heart disease of native coronary artery without angina pectoris: Secondary | ICD-10-CM | POA: Diagnosis present

## 2024-07-06 DIAGNOSIS — Z7901 Long term (current) use of anticoagulants: Secondary | ICD-10-CM

## 2024-07-06 DIAGNOSIS — I471 Supraventricular tachycardia, unspecified: Secondary | ICD-10-CM | POA: Diagnosis not present

## 2024-07-06 DIAGNOSIS — Z886 Allergy status to analgesic agent status: Secondary | ICD-10-CM

## 2024-07-06 DIAGNOSIS — A419 Sepsis, unspecified organism: Principal | ICD-10-CM | POA: Diagnosis present

## 2024-07-06 DIAGNOSIS — Z91013 Allergy to seafood: Secondary | ICD-10-CM

## 2024-07-06 DIAGNOSIS — Z9911 Dependence on respirator [ventilator] status: Secondary | ICD-10-CM

## 2024-07-06 DIAGNOSIS — I480 Paroxysmal atrial fibrillation: Secondary | ICD-10-CM | POA: Diagnosis present

## 2024-07-06 DIAGNOSIS — Z79899 Other long term (current) drug therapy: Secondary | ICD-10-CM

## 2024-07-06 DIAGNOSIS — R1310 Dysphagia, unspecified: Secondary | ICD-10-CM | POA: Diagnosis present

## 2024-07-06 DIAGNOSIS — Z931 Gastrostomy status: Secondary | ICD-10-CM

## 2024-07-06 DIAGNOSIS — E43 Unspecified severe protein-calorie malnutrition: Secondary | ICD-10-CM | POA: Diagnosis present

## 2024-07-06 DIAGNOSIS — F419 Anxiety disorder, unspecified: Secondary | ICD-10-CM | POA: Diagnosis present

## 2024-07-06 DIAGNOSIS — Z885 Allergy status to narcotic agent status: Secondary | ICD-10-CM

## 2024-07-06 DIAGNOSIS — J9621 Acute and chronic respiratory failure with hypoxia: Secondary | ICD-10-CM | POA: Diagnosis present

## 2024-07-06 DIAGNOSIS — G1221 Amyotrophic lateral sclerosis: Secondary | ICD-10-CM | POA: Diagnosis present

## 2024-07-06 DIAGNOSIS — R627 Adult failure to thrive: Secondary | ICD-10-CM | POA: Diagnosis present

## 2024-07-06 DIAGNOSIS — Z930 Tracheostomy status: Secondary | ICD-10-CM

## 2024-07-06 DIAGNOSIS — L89153 Pressure ulcer of sacral region, stage 3: Secondary | ICD-10-CM | POA: Diagnosis present

## 2024-07-06 DIAGNOSIS — Z880 Allergy status to penicillin: Secondary | ICD-10-CM

## 2024-07-06 DIAGNOSIS — Z681 Body mass index (BMI) 19 or less, adult: Secondary | ICD-10-CM

## 2024-07-06 DIAGNOSIS — Z881 Allergy status to other antibiotic agents status: Secondary | ICD-10-CM

## 2024-07-06 DIAGNOSIS — J9601 Acute respiratory failure with hypoxia: Secondary | ICD-10-CM

## 2024-07-06 DIAGNOSIS — J9620 Acute and chronic respiratory failure, unspecified whether with hypoxia or hypercapnia: Secondary | ICD-10-CM | POA: Diagnosis present

## 2024-07-06 DIAGNOSIS — R7401 Elevation of levels of liver transaminase levels: Secondary | ICD-10-CM | POA: Diagnosis present

## 2024-07-06 DIAGNOSIS — J159 Unspecified bacterial pneumonia: Secondary | ICD-10-CM | POA: Diagnosis present

## 2024-07-06 DIAGNOSIS — R0602 Shortness of breath: Secondary | ICD-10-CM | POA: Diagnosis not present

## 2024-07-06 DIAGNOSIS — Y848 Other medical procedures as the cause of abnormal reaction of the patient, or of later complication, without mention of misadventure at the time of the procedure: Secondary | ICD-10-CM | POA: Diagnosis present

## 2024-07-06 DIAGNOSIS — E785 Hyperlipidemia, unspecified: Secondary | ICD-10-CM | POA: Diagnosis present

## 2024-07-06 DIAGNOSIS — I119 Hypertensive heart disease without heart failure: Secondary | ICD-10-CM | POA: Diagnosis present

## 2024-07-06 DIAGNOSIS — E8729 Other acidosis: Secondary | ICD-10-CM | POA: Diagnosis present

## 2024-07-06 LAB — CBC WITH DIFFERENTIAL/PLATELET
Abs Immature Granulocytes: 0.13 10*3/uL — ABNORMAL HIGH (ref 0.00–0.07)
Basophils Absolute: 0.1 10*3/uL (ref 0.0–0.1)
Basophils Relative: 0 %
Eosinophils Absolute: 0 10*3/uL (ref 0.0–0.5)
Eosinophils Relative: 0 %
HCT: 35.9 % — ABNORMAL LOW (ref 36.0–46.0)
Hemoglobin: 11.4 g/dL — ABNORMAL LOW (ref 12.0–15.0)
Immature Granulocytes: 1 %
Lymphocytes Relative: 4 %
Lymphs Abs: 0.6 10*3/uL — ABNORMAL LOW (ref 0.7–4.0)
MCH: 27.9 pg (ref 26.0–34.0)
MCHC: 31.8 g/dL (ref 30.0–36.0)
MCV: 88 fL (ref 80.0–100.0)
Monocytes Absolute: 2 10*3/uL — ABNORMAL HIGH (ref 0.1–1.0)
Monocytes Relative: 12 %
Neutro Abs: 14.3 10*3/uL — ABNORMAL HIGH (ref 1.7–7.7)
Neutrophils Relative %: 83 %
Platelets: 341 10*3/uL (ref 150–400)
RBC: 4.08 MIL/uL (ref 3.87–5.11)
RDW: 15.4 % (ref 11.5–15.5)
WBC Morphology: INCREASED
WBC: 17.1 10*3/uL — ABNORMAL HIGH (ref 4.0–10.5)
nRBC: 0 % (ref 0.0–0.2)

## 2024-07-06 LAB — COMPREHENSIVE METABOLIC PANEL WITH GFR
ALT: 104 U/L — ABNORMAL HIGH (ref 0–44)
AST: 64 U/L — ABNORMAL HIGH (ref 15–41)
Albumin: 3 g/dL — ABNORMAL LOW (ref 3.5–5.0)
Alkaline Phosphatase: 89 U/L (ref 38–126)
Anion gap: 14 (ref 5–15)
BUN: 17 mg/dL (ref 8–23)
CO2: 25 mmol/L (ref 22–32)
Calcium: 9.1 mg/dL (ref 8.9–10.3)
Chloride: 94 mmol/L — ABNORMAL LOW (ref 98–111)
Creatinine, Ser: 0.35 mg/dL — ABNORMAL LOW (ref 0.44–1.00)
GFR, Estimated: >60 mL/min (ref >60)
Glucose, Bld: 162 mg/dL — ABNORMAL HIGH (ref 70–99)
Potassium: 4.8 mmol/L (ref 3.5–5.1)
Sodium: 133 mmol/L — ABNORMAL LOW (ref 135–145)
Total Bilirubin: 1 mg/dL (ref 0.0–1.2)
Total Protein: 7.7 g/dL (ref 6.5–8.1)

## 2024-07-06 LAB — I-STAT ARTERIAL BLOOD GAS, ED
Acid-Base Excess: 3 mmol/L — ABNORMAL HIGH (ref 0.0–2.0)
Bicarbonate: 30.2 mmol/L — ABNORMAL HIGH (ref 20.0–28.0)
Calcium, Ion: 1.23 mmol/L (ref 1.15–1.40)
HCT: 34 % — ABNORMAL LOW (ref 36.0–46.0)
Hemoglobin: 11.6 g/dL — ABNORMAL LOW (ref 12.0–15.0)
O2 Saturation: 96 %
Patient temperature: 99.9
Potassium: 4.1 mmol/L (ref 3.5–5.1)
Sodium: 134 mmol/L — ABNORMAL LOW (ref 135–145)
TCO2: 32 mmol/L (ref 22–32)
pCO2 arterial: 56.5 mmHg — ABNORMAL HIGH (ref 32–48)
pH, Arterial: 7.339 — ABNORMAL LOW (ref 7.35–7.45)
pO2, Arterial: 90 mmHg (ref 83–108)

## 2024-07-06 LAB — I-STAT CG4 LACTIC ACID, ED: Lactic Acid, Venous: 1.4 mmol/L (ref 0.5–1.9)

## 2024-07-06 MED ORDER — SODIUM CHLORIDE 0.9 % IV BOLUS (SEPSIS)
500.0000 mL | Freq: Once | INTRAVENOUS | Status: AC
  Administered 2024-07-07: 500 mL via INTRAVENOUS

## 2024-07-06 MED ORDER — DIAZEPAM 5 MG/ML IJ SOLN
2.5000 mg | Freq: Once | INTRAMUSCULAR | Status: AC
  Administered 2024-07-06: 2.5 mg via INTRAVENOUS
  Filled 2024-07-06: qty 2

## 2024-07-06 MED ORDER — DILTIAZEM LOAD VIA INFUSION
20.0000 mg | Freq: Once | INTRAVENOUS | Status: AC
  Administered 2024-07-06: 20 mg via INTRAVENOUS
  Filled 2024-07-06: qty 20

## 2024-07-06 MED ORDER — LORAZEPAM 2 MG/ML IJ SOLN: 1.0000 mg | Freq: Once | INTRAMUSCULAR | Status: DC

## 2024-07-06 MED ORDER — SODIUM CHLORIDE 0.9 % IV SOLN
2.0000 g | Freq: Once | INTRAVENOUS | Status: AC
  Administered 2024-07-07: 2 g via INTRAVENOUS
  Filled 2024-07-06: qty 12.5

## 2024-07-06 MED ORDER — DILTIAZEM HCL-DEXTROSE 125-5 MG/125ML-% IV SOLN (PREMIX)
5.0000 mg/h | INTRAVENOUS | Status: DC
  Administered 2024-07-06: 5 mg/h via INTRAVENOUS
  Administered 2024-07-07: 15 mg/h via INTRAVENOUS
  Filled 2024-07-06: qty 125

## 2024-07-06 MED ORDER — SODIUM CHLORIDE 0.9 % IV SOLN: 2.0000 g | Freq: Once | INTRAVENOUS | Status: DC

## 2024-07-06 MED ORDER — DIAZEPAM 5 MG/ML IJ SOLN
1.2500 mg | Freq: Once | INTRAMUSCULAR | Status: AC
  Administered 2024-07-06: 1.25 mg via INTRAVENOUS
  Filled 2024-07-06: qty 2

## 2024-07-06 MED ORDER — SODIUM CHLORIDE 0.9 % IV BOLUS (SEPSIS)
1000.0000 mL | Freq: Once | INTRAVENOUS | Status: AC
  Administered 2024-07-06: 1000 mL via INTRAVENOUS

## 2024-07-06 MED ORDER — LACTATED RINGERS IV SOLN: INTRAVENOUS | Status: DC

## 2024-07-06 MED ORDER — VANCOMYCIN HCL IN DEXTROSE 1-5 GM/200ML-% IV SOLN
1000.0000 mg | Freq: Once | INTRAVENOUS | Status: AC
  Administered 2024-07-07: 1000 mg via INTRAVENOUS
  Filled 2024-07-06: qty 200

## 2024-07-06 NOTE — ED Triage Notes (Signed)
 Pt arrives from [facility] w/ c/o tachycardia. Pt has hx of PNA. Per [facility] pt has had increase in RR and has been tachycardic. Was in AFIB RVR upon [facility] arrival but converted on her own.

## 2024-07-06 NOTE — ED Provider Notes (Signed)
  EMERGENCY DEPARTMENT AT [facility] Provider Note   CSN: [id number] Arrival date & time: 07/06/24  2104     Patient presents with: Tachycardia   Dominique Brewer is a 78 y.o. female.   HPI Patient presents from nearby long-term acute care facility with concern for fever, increased work of breathing, tachycardia. Patient is trach/vent dependent, cannot speak.  She has been in the facility for several months. She has a history of A-fib. Per EMS, patient was febrile yesterday, had increased work of breathing since that time, requiring additional suctioning, and has had tachycardia.  They note the patient was in A-fib RVR on arrival, converted en route, and the patient self denies pain, states she has cough, congestion, weakness.  She communicates via mouthing words and writing responses.    Prior to Admission medications   Medication Sig Start Date End Date Taking? Authorizing Provider  acetaminophen  (TYLENOL ) 325 MG tablet Place 650 mg into feeding tube every 6 (six) hours as needed (pain).    [provider]  carboxymethylcellulose (REFRESH PLUS) 0.5 % SOLN Place 1 drop into both eyes every 6 (six) hours as needed (dry eye).    [provider]  docusate (COLACE) 50 MG/5ML liquid Place 10 mLs (100 mg total) into feeding tube 2 (two) times daily. 05/30/24   Jenna Maude BRAVO, NP  enoxaparin  (LOVENOX ) 30 MG/0.3ML injection Inject 0.3 mLs (30 mg total) into the skin daily. 05/30/24   Babcock, Peter E, NP  guaiFENesin 200 MG tablet Place 200 mg into feeding tube every 6 (six) hours as needed for cough or to loosen phlegm.    [provider]  lansoprazole (PREVACID SOLUTAB) 30 MG disintegrating tablet Take 30 mg by mouth daily.    [provider]  latanoprost  (XALATAN ) 0.005 % ophthalmic solution Place 1 drop into both eyes at bedtime.    [provider]  melatonin 1 MG TABS tablet Take 1 mg by mouth at bedtime as needed (sleep).     [provider]  Menthol-Zinc Oxide (CALMOSEPTINE) 0.44-20.6 % OINT Apply 1 application  topically See admin instructions. Apply a thin layer topically to sacral region for incontinence episodes, three times daily on every shift.    [provider]  midodrine  (PROAMATINE ) 5 MG tablet Place 3 tablets (15 mg total) into feeding tube every 8 (eight) hours. 05/30/24   Babcock, Peter E, NP  Multiple Vitamin (MULTIVITAMIN) tablet Place 1 tablet into feeding tube daily.    [provider]  Nutritional Supplements (FEEDING SUPPLEMENT, OSMOLITE 1.5 CAL,) LIQD Place 1,430-1,560 mLs into feeding tube continuous. May be off for up to 2 hours daily for ADL's and activities. Total for day 1430-1565mL's (7854-6759 kCal)    [provider]  thiamine  (VITAMIN B1) 100 MG tablet Place 100 mg into feeding tube daily.    [provider]  Zinc Sulfate 220 (50 Zn) MG TABS Give 1 tablet by tube daily.    [provider]    Allergies: Iodine, Shellfish allergy, Zymaxid [gatifloxacin], Amlodipine, Aleve [naproxen], Penicillins, Adoxa [doxycycline], Ak-mycin [erythromycin], Benadryl itch stopping [diphenhydramine-zinc acetate], and Codeine    Review of Systems  Updated Vital Signs BP (!) 113/94   Pulse (!) 130   Temp 99.9 F (37.7 C) (Oral)   Resp (!) 31   Ht 5' (1.524 m)   SpO2 99%   BMI 18.77 kg/m   Physical Exam Vitals and nursing note reviewed.  Constitutional:      Appearance: She is  well-developed. She is ill-appearing and diaphoretic.  HENT:     Head: Normocephalic and atraumatic.  Eyes:     Conjunctiva/sclera: Conjunctivae normal.  Cardiovascular:     Rate and Rhythm: Tachycardia present. Rhythm irregular.  Pulmonary:     Effort: Tachypnea and accessory muscle usage present.  Abdominal:     General: There is no distension.  Skin:    General: Skin is warm.  Neurological:     Mental Status: She is alert and oriented to person, place, and time.      Cranial Nerves: No cranial nerve deficit.     Motor: Atrophy present.  Psychiatric:        Mood and Affect: Mood normal.     (all labs ordered are listed, but only abnormal results are displayed) Labs Reviewed  CBC WITH DIFFERENTIAL/PLATELET - Abnormal; Notable for the following components:      Result Value   WBC 17.1 (*)    Hemoglobin 11.4 (*)    HCT 35.9 (*)    Neutro Abs 14.3 (*)    Lymphs Abs 0.6 (*)    Monocytes Absolute 2.0 (*)    Abs Immature Granulocytes 0.13 (*)    All other components within normal limits  COMPREHENSIVE METABOLIC PANEL WITH GFR - Abnormal; Notable for the following components:   Sodium 133 (*)    Chloride 94 (*)    Glucose, Bld 162 (*)    Creatinine, Ser 0.35 (*)    Albumin  3.0 (*)    AST 64 (*)    ALT 104 (*)    All other components within normal limits  CULTURE, BLOOD (ROUTINE X 2)  CULTURE, BLOOD (ROUTINE X 2)  BLOOD GAS, ARTERIAL  PROTIME-INR  I-STAT CG4 LACTIC ACID, ED  I-STAT CG4 LACTIC ACID, ED    EKG: EKG Interpretation Date/Time:  Sunday July 06 2024 21:26:21 EDT Ventricular Rate:  188 PR Interval:    QRS Duration:  78 QT Interval:  251 QTC Calculation: 407 R Axis:   -36  Text Interpretation: Atrial fibrillation with rapid V-rate Confirmed by Garrick Charleston [phone]) on 07/06/2024 9:33:33 PM  Radiology: [facility] Chest Port 1 View Result Date: 07/06/2024 CLINICAL DATA:  Questionable sepsis-evaluate for abnormality. Tachycardia. EXAM: PORTABLE CHEST 1 VIEW COMPARISON:  05/29/2024 FINDINGS: Tracheostomy. Stable cardiomediastinal silhouette. Aortic atherosclerotic calcification. Bronchial wall thickening. Patchy bilateral airspace and interstitial opacities no pleural effusion or pneumothorax. No displaced rib fractures. IMPRESSION: Findings are suspicious for bronchopneumonia and/or aspiration. Electronically Signed   By: Norman Gatlin M.D.   On: 07/06/2024 21:37     Procedures   Medications Ordered in the ED  diltiazem   (CARDIZEM ) 1 mg/mL load via infusion 20 mg (20 mg Intravenous Bolus from Bag 07/06/24 2152)    And  diltiazem  (CARDIZEM ) 125 mg in dextrose  5% 125 mL (1 mg/mL) infusion (15 mg/hr Intravenous Rate/Dose Change 07/06/24 2227)  lactated ringers  infusion (has no administration in time range)  sodium chloride  0.9 % bolus 1,000 mL (has no administration in time range)    And  sodium chloride  0.9 % bolus 500 mL (has no administration in time range)  vancomycin  (VANCOCIN ) IVPB 1000 mg/200 mL premix (has no administration in time range)  ceFEPIme  (MAXIPIME ) 2 g in sodium chloride  0.9 % 100 mL IVPB (has no administration in time range)  diazepam  (VALIUM ) injection 1.25 mg (1.25 mg Intravenous Given 07/06/24 2217)  Medical Decision Making Adult female, trach, vent dependent now presents in A-fib RVR, with increased work of breathing, the context of recent residence at long-term acute care facility.  Concern for sepsis, pneumonia, A-fib RVR with symptoms. Patient placed on continuous monitoring, pulse oximetry vent continued with assistance of respiratory therapy, suctioning commenced. Cardiac 130, 140 A-fib abnormal pulse ox 99% with mechanical ventilation  Amount and/or Complexity of Data Reviewed Independent Historian: EMS External Data Reviewed: notes. Labs: ordered. Decision-making details documented in ED Course. Radiology: ordered and independent interpretation performed. Decision-making details documented in ED Course. ECG/medicine tests: ordered and independent interpretation performed. Decision-making details documented in ED Course.  Risk Prescription drug management. Decision regarding hospitalization. Diagnosis or treatment significantly limited by social determinants of health.  Now on Cardizem  drip, with additional suctioning patient more comfortable.  Patient has received broad-spectrum antibiotics for x-ray evidence of pneumonia, though she has no  lactic acidosis she does have leukocytosis, and given her pneumonia, meets sepsis criteria.  No hypotension.  I discussed patient's case with our critical care colleagues patient will be admitted for further monitoring, management.   Final diagnoses:  Severe sepsis (HCC)  Atrial fibrillation with RVR (HCC)   CRITICAL CARE Performed by: Lamar Salen Total critical care time: 45 minutes Critical care time was exclusive of separately billable procedures and treating other patients. Critical care was necessary to treat or prevent imminent or life-threatening deterioration. Critical care was time spent personally by me on the following activities: development of treatment plan with patient and/or surrogate as well as nursing, discussions with consultants, evaluation of patient's response to treatment, examination of patient, obtaining history from patient or surrogate, ordering and performing treatments and interventions, ordering and review of laboratory studies, ordering and review of radiographic studies, pulse oximetry and re-evaluation of patient's condition.    Salen Lamar, MD 07/06/24 [phone]

## 2024-07-06 NOTE — Progress Notes (Signed)
 Elink monitoring for the code sepsis protocol.

## 2024-07-06 NOTE — ED Notes (Signed)
 Pt does not want to be stuck on left side (arm or hand). Unable to obtain blood cultures on right side due to IV in pt's hand, pt receiving medicine through IV and can't stick above IV.  Nurse aware. KM

## 2024-07-06 NOTE — H&P (Signed)
 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  HTN, HLD, CAD, Dysphagia, failure to thrive and depression.  She has a h/o A fib and was in A fib with RVR on admission started on Cardizem  drip.  Cxr showing pneumonia.  Normal lactic acid and increased WBC 17, Cr 0.35.  She has a h/o stenotrophomonas maltophilia lung infection. She is normally on 40% FIO2 but currently on 60% FIO2 and c/o SOB.  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  HTN, HLD, CAD, Dysphagia, failure to thrive and depression.  Significant Hospital Events: Including procedures, antibiotic start and stop dates in addition to other pertinent events   7/13: Admit to ICU  Interim History / Subjective:  N/a  Objective    Blood pressure (!) 113/94, pulse (!) 130, temperature 99.9 F (37.7 C), temperature source Oral, resp. rate (!) 31, height 5' (1.524 m), SpO2 99%.    Vent Mode: PRVC FiO2 (%):  [50 %] 50 % Set Rate:  [16 bmp] 16 bmp Vt Set:  [300 mL] 300 mL PEEP:  [5 cmH20] 5 cmH20  No intake or output data in the 24 hours ending 07/06/24 2332 There were no vitals filed for this visit.  Examination: General: alert and awake, mouthing words, c/o SOB on vent HENT: PERRLA, no icterus EOMI, trach site clean no excoriation Lungs: rhonchi b/k no rales, symmetric chest excursions Cardiovascular: irreg irreg, tachy, no murmurs Abdomen: soft nt nd bs pos, peg pos, peg site clean Extremities: no cyanosis, clubbing or edema Neuro: Alert and oriented, following all extremities GU: Foley  Resolved problem list   Assessment and Plan  Acute on chronic hypoxic respiratory failure Continue with vent management Follow ABGs/VBGs/O2 sats Adjust vent as  possible and based on above Ventilator Associated pneumonia with h/o stenotrophomonas maltophilia lung infection.  Broad spectrum antibiotics to cover Pseudomonas/Stenotrophomonas A fib with RVR Continue with Cardizem  Anticoagulation Likely made worse by pneumonia Acute bronchospasm secondary to pneumonia Nebs with Xopenex  and Atrovent  H/o Trach and chronic vent Vent management H/o PEG PEG tube Feeds H/o ALS Continue with supportive care   Best Practice (right click and Reselect all SmartList Selections daily)   Diet/type: tubefeeds DVT prophylaxis prophylactic heparin  Pressure ulcer(s): N/A GI prophylaxis: H2B Lines: N/A Foley:  Yes, and it is still needed Code Status:  full code   Labs   CBC: Recent Labs  Lab 07/06/24 2154 07/06/24 2308  WBC 17.1*  --   NEUTROABS 14.3*  --   HGB 11.4* 11.6*  HCT 35.9* 34.0*  MCV 88.0  --   PLT 341  --     Basic Metabolic Panel: Recent Labs  Lab 07/06/24 2135 07/06/24 2308  NA 133* 134*  K 4.8 4.1  CL 94*  --   CO2 25  --   GLUCOSE 162*  --   BUN 17  --   CREATININE 0.35*  --   CALCIUM 9.1  --    GFR: CrCl cannot be calculated (Unknown ideal weight.). Recent Labs  Lab 07/06/24 2154 07/06/24 2201  WBC 17.1*  --   LATICACIDVEN  --  1.4    Liver Function Tests: Recent Labs  Lab 07/06/24 2135  AST 64*  ALT 104*  ALKPHOS 89  BILITOT 1.0  PROT 7.7  ALBUMIN  3.0*   No results for input(s): LIPASE, AMYLASE in the last 168 hours. No results for input(s): AMMONIA in the last 168 hours.  ABG    Component Value Date/Time   PHART 7.339 (L) 07/06/2024 2308   PCO2ART 56.5 (H) 07/06/2024 2308   PO2ART 90 07/06/2024 2308   HCO3 30.2 (H) 07/06/2024 2308   TCO2 32 07/06/2024 2308   O2SAT 96 07/06/2024 2308     Coagulation Profile: No results for input(s): INR, PROTIME in the last 168 hours.  Cardiac Enzymes: No results for input(s): CKTOTAL, CKMB, CKMBINDEX, TROPONINI in the last 168  hours.  HbA1C: Hgb A1c MFr Bld  Date/Time Value Ref Range Status  05/26/2024 04:31 PM 4.9 4.8 - 5.6 % Final    Comment:    (NOTE) Diagnosis of Diabetes The following HbA1c ranges recommended by the American Diabetes Association (ADA) may be used as an aid in the diagnosis of diabetes mellitus.  Hemoglobin             Suggested A1C NGSP%              Diagnosis  <5.7                   Non Diabetic  5.7-6.4                Pre-Diabetic  >6.4                   Diabetic  <7.0                   Glycemic control for                       adults with diabetes.      CBG: No results for input(s): GLUCAP in the last 168 hours.  Review of Systems:   SOB  Past Medical History:  She,  has a past medical history of Respiratory failure (HCC).   Surgical History:   Past Surgical History:  Procedure Laterality Date   TRACHEOSTOMY       Social History:   reports that she has never smoked. She has never used smokeless tobacco.   Family History:  Her family history is not on file.   Allergies Allergies  Allergen Reactions   Iodine Anaphylaxis   Shellfish Allergy Anaphylaxis    Due to iodine content   Zymaxid [Gatifloxacin] Other (See Comments)    Weakness to legs causing frequent falls; inability to stand or walk   Amlodipine Swelling    Leg and ankle swelling    Aleve [Naproxen] Swelling   Penicillins Other (See Comments)    Fever and rash Tolerated cephalosporins    Adoxa [Doxycycline] Other (See Comments)    Dizziness    Ak-Mycin [Erythromycin] Nausea Only   Benadryl Itch Stopping [Diphenhydramine-Zinc Acetate] Rash    Blistering rash Reaction to topical diphenhydramine    Codeine Other (See Comments)    Unknown reaction     Home Medications  Prior to Admission medications   Medication Sig Start Date End Date Taking? Authorizing Provider  acetaminophen  (TYLENOL ) 325 MG tablet Place 650 mg into feeding tube every 6 (six) hours as needed (pain).     [provider]  carboxymethylcellulose (REFRESH PLUS) 0.5 % SOLN Place 1 drop into both eyes every 6 (six) hours as needed (dry eye).  [provider]  docusate (COLACE) 50 MG/5ML liquid Place 10 mLs (100 mg total) into feeding tube 2 (two) times daily. 05/30/24   Jenna Maude BRAVO, NP  enoxaparin  (LOVENOX ) 30 MG/0.3ML injection Inject 0.3 mLs (30 mg total) into the skin daily. 05/30/24   Babcock, Peter E, NP  guaiFENesin 200 MG tablet Place 200 mg into feeding tube every 6 (six) hours as needed for cough or to loosen phlegm.    [provider]  lansoprazole (PREVACID SOLUTAB) 30 MG disintegrating tablet Take 30 mg by mouth daily.    [provider]  latanoprost  (XALATAN ) 0.005 % ophthalmic solution Place 1 drop into both eyes at bedtime.    [provider]  melatonin 1 MG TABS tablet Take 1 mg by mouth at bedtime as needed (sleep).    [provider]  Menthol-Zinc Oxide (CALMOSEPTINE) 0.44-20.6 % OINT Apply 1 application  topically See admin instructions. Apply a thin layer topically to sacral region for incontinence episodes, three times daily on every shift.    [provider]  midodrine  (PROAMATINE ) 5 MG tablet Place 3 tablets (15 mg total) into feeding tube every 8 (eight) hours. 05/30/24   Babcock, Peter E, NP  Multiple Vitamin (MULTIVITAMIN) tablet Place 1 tablet into feeding tube daily.    [provider]  Nutritional Supplements (FEEDING SUPPLEMENT, OSMOLITE 1.5 CAL,) LIQD Place 1,430-1,560 mLs into feeding tube continuous. May be off for up to 2 hours daily for ADL's and activities. Total for day 1430-1517mL's (7854-6759 kCal)    [provider]  thiamine  (VITAMIN B1) 100 MG tablet Place 100 mg into feeding tube daily.    [provider]  Zinc Sulfate 220 (50 Zn) MG TABS Give 1 tablet by tube daily.    [provider]     Critical care time: 64   The patient is critically ill with multiple organ  system failure and requires high complexity decision making for assessment and support, frequent evaluation and titration of therapies, advanced monitoring, review of radiographic studies and interpretation of complex data.   Critical Care Time devoted to patient care services, exclusive of separately billable procedures, described in this note is 35 minutes.   Orlin Fairly, MD [facility] See Amion for pager  If no response to pager , please call [phone] until 7pm After 7:00 pm call Elink  [phone] 07/06/2024, 11:32 PM

## 2024-07-07 DIAGNOSIS — E785 Hyperlipidemia, unspecified: Secondary | ICD-10-CM | POA: Diagnosis present

## 2024-07-07 DIAGNOSIS — E8729 Other acidosis: Secondary | ICD-10-CM | POA: Diagnosis present

## 2024-07-07 DIAGNOSIS — Y848 Other medical procedures as the cause of abnormal reaction of the patient, or of later complication, without mention of misadventure at the time of the procedure: Secondary | ICD-10-CM | POA: Diagnosis present

## 2024-07-07 DIAGNOSIS — Z681 Body mass index (BMI) 19 or less, adult: Secondary | ICD-10-CM | POA: Diagnosis not present

## 2024-07-07 DIAGNOSIS — G1221 Amyotrophic lateral sclerosis: Secondary | ICD-10-CM | POA: Diagnosis present

## 2024-07-07 DIAGNOSIS — J9620 Acute and chronic respiratory failure, unspecified whether with hypoxia or hypercapnia: Secondary | ICD-10-CM | POA: Diagnosis present

## 2024-07-07 DIAGNOSIS — J9801 Acute bronchospasm: Secondary | ICD-10-CM | POA: Diagnosis present

## 2024-07-07 DIAGNOSIS — I4891 Unspecified atrial fibrillation: Secondary | ICD-10-CM | POA: Diagnosis not present

## 2024-07-07 DIAGNOSIS — I471 Supraventricular tachycardia, unspecified: Secondary | ICD-10-CM | POA: Diagnosis not present

## 2024-07-07 DIAGNOSIS — I4711 Inappropriate sinus tachycardia, so stated: Secondary | ICD-10-CM | POA: Diagnosis not present

## 2024-07-07 DIAGNOSIS — I119 Hypertensive heart disease without heart failure: Secondary | ICD-10-CM | POA: Diagnosis present

## 2024-07-07 DIAGNOSIS — Z931 Gastrostomy status: Secondary | ICD-10-CM | POA: Diagnosis not present

## 2024-07-07 DIAGNOSIS — R6521 Severe sepsis with septic shock: Secondary | ICD-10-CM | POA: Diagnosis present

## 2024-07-07 DIAGNOSIS — F32A Depression, unspecified: Secondary | ICD-10-CM | POA: Diagnosis present

## 2024-07-07 DIAGNOSIS — I2510 Atherosclerotic heart disease of native coronary artery without angina pectoris: Secondary | ICD-10-CM | POA: Diagnosis present

## 2024-07-07 DIAGNOSIS — J9621 Acute and chronic respiratory failure with hypoxia: Secondary | ICD-10-CM | POA: Diagnosis present

## 2024-07-07 DIAGNOSIS — R1310 Dysphagia, unspecified: Secondary | ICD-10-CM | POA: Diagnosis present

## 2024-07-07 DIAGNOSIS — R0602 Shortness of breath: Secondary | ICD-10-CM | POA: Diagnosis present

## 2024-07-07 DIAGNOSIS — J189 Pneumonia, unspecified organism: Secondary | ICD-10-CM | POA: Diagnosis not present

## 2024-07-07 DIAGNOSIS — E43 Unspecified severe protein-calorie malnutrition: Secondary | ICD-10-CM | POA: Diagnosis present

## 2024-07-07 DIAGNOSIS — I480 Paroxysmal atrial fibrillation: Secondary | ICD-10-CM | POA: Diagnosis present

## 2024-07-07 DIAGNOSIS — J95851 Ventilator associated pneumonia: Secondary | ICD-10-CM | POA: Diagnosis present

## 2024-07-07 DIAGNOSIS — J159 Unspecified bacterial pneumonia: Secondary | ICD-10-CM | POA: Diagnosis present

## 2024-07-07 DIAGNOSIS — Z930 Tracheostomy status: Secondary | ICD-10-CM | POA: Diagnosis not present

## 2024-07-07 DIAGNOSIS — L89153 Pressure ulcer of sacral region, stage 3: Secondary | ICD-10-CM | POA: Diagnosis present

## 2024-07-07 DIAGNOSIS — R627 Adult failure to thrive: Secondary | ICD-10-CM | POA: Diagnosis present

## 2024-07-07 DIAGNOSIS — F419 Anxiety disorder, unspecified: Secondary | ICD-10-CM | POA: Diagnosis present

## 2024-07-07 DIAGNOSIS — Z9911 Dependence on respirator [ventilator] status: Secondary | ICD-10-CM | POA: Diagnosis not present

## 2024-07-07 DIAGNOSIS — Z79899 Other long term (current) drug therapy: Secondary | ICD-10-CM | POA: Diagnosis not present

## 2024-07-07 DIAGNOSIS — A419 Sepsis, unspecified organism: Secondary | ICD-10-CM | POA: Diagnosis present

## 2024-07-07 LAB — BASIC METABOLIC PANEL WITH GFR
Anion gap: 11 (ref 5–15)
BUN: 14 mg/dL (ref 8–23)
CO2: 26 mmol/L (ref 22–32)
Calcium: 9 mg/dL (ref 8.9–10.3)
Chloride: 97 mmol/L — ABNORMAL LOW (ref 98–111)
Creatinine, Ser: <0.3 mg/dL — ABNORMAL LOW (ref 0.44–1.00)
Glucose, Bld: 170 mg/dL — ABNORMAL HIGH (ref 70–99)
Potassium: 4 mmol/L (ref 3.5–5.1)
Sodium: 134 mmol/L — ABNORMAL LOW (ref 135–145)

## 2024-07-07 LAB — CBC
HCT: 34.2 % — ABNORMAL LOW (ref 36.0–46.0)
Hemoglobin: 10.9 g/dL — ABNORMAL LOW (ref 12.0–15.0)
MCH: 28.1 pg (ref 26.0–34.0)
MCHC: 31.9 g/dL (ref 30.0–36.0)
MCV: 88.1 fL (ref 80.0–100.0)
Platelets: 315 K/uL (ref 150–400)
RBC: 3.88 MIL/uL (ref 3.87–5.11)
RDW: 15.2 % (ref 11.5–15.5)
WBC: 21.1 K/uL — ABNORMAL HIGH (ref 4.0–10.5)
nRBC: 0 % (ref 0.0–0.2)

## 2024-07-07 LAB — CORTISOL: Cortisol, Plasma: 48 ug/dL

## 2024-07-07 LAB — POCT I-STAT 7, (LYTES, BLD GAS, ICA,H+H)
Acid-Base Excess: 2 mmol/L (ref 0.0–2.0)
Bicarbonate: 30.8 mmol/L — ABNORMAL HIGH (ref 20.0–28.0)
Calcium, Ion: 1.29 mmol/L (ref 1.15–1.40)
HCT: 33 % — ABNORMAL LOW (ref 36.0–46.0)
Hemoglobin: 11.2 g/dL — ABNORMAL LOW (ref 12.0–15.0)
O2 Saturation: 90 %
Patient temperature: 98.1
Potassium: 4.1 mmol/L (ref 3.5–5.1)
Sodium: 132 mmol/L — ABNORMAL LOW (ref 135–145)
TCO2: 33 mmol/L — ABNORMAL HIGH (ref 22–32)
pCO2 arterial: 69.7 mmHg (ref 32–48)
pH, Arterial: 7.251 — ABNORMAL LOW (ref 7.35–7.45)
pO2, Arterial: 69 mmHg — ABNORMAL LOW (ref 83–108)

## 2024-07-07 LAB — MRSA NEXT GEN BY PCR, NASAL: MRSA by PCR Next Gen: NOT DETECTED

## 2024-07-07 LAB — URINALYSIS, ROUTINE W REFLEX MICROSCOPIC
Bilirubin Urine: NEGATIVE
Glucose, UA: NEGATIVE mg/dL
Hgb urine dipstick: NEGATIVE
Ketones, ur: NEGATIVE mg/dL
Leukocytes,Ua: NEGATIVE
Nitrite: NEGATIVE
Protein, ur: NEGATIVE mg/dL
Specific Gravity, Urine: 1.009 (ref 1.005–1.030)
pH: 6 (ref 5.0–8.0)

## 2024-07-07 LAB — BLOOD GAS, VENOUS
Acid-Base Excess: 3.3 mmol/L — ABNORMAL HIGH (ref 0.0–2.0)
Bicarbonate: 32.6 mmol/L — ABNORMAL HIGH (ref 20.0–28.0)
O2 Saturation: 98.8 %
Patient temperature: 37
pCO2, Ven: 76 mmHg (ref 44–60)
pH, Ven: 7.24 — ABNORMAL LOW (ref 7.25–7.43)
pO2, Ven: 95 mmHg — ABNORMAL HIGH (ref 32–45)

## 2024-07-07 LAB — TSH: TSH: 1.039 u[IU]/mL (ref 0.350–4.500)

## 2024-07-07 LAB — GLUCOSE, CAPILLARY
Glucose-Capillary: 111 mg/dL — ABNORMAL HIGH (ref 70–99)
Glucose-Capillary: 121 mg/dL — ABNORMAL HIGH (ref 70–99)
Glucose-Capillary: 125 mg/dL — ABNORMAL HIGH (ref 70–99)
Glucose-Capillary: 131 mg/dL — ABNORMAL HIGH (ref 70–99)
Glucose-Capillary: 197 mg/dL — ABNORMAL HIGH (ref 70–99)
Glucose-Capillary: 215 mg/dL — ABNORMAL HIGH (ref 70–99)
Glucose-Capillary: 97 mg/dL (ref 70–99)

## 2024-07-07 LAB — PROTIME-INR
INR: 1.4 — ABNORMAL HIGH (ref 0.8–1.2)
Prothrombin Time: 17.5 s — ABNORMAL HIGH (ref 11.4–15.2)

## 2024-07-07 LAB — STREP PNEUMONIAE URINARY ANTIGEN: Strep Pneumo Urinary Antigen: NEGATIVE

## 2024-07-07 LAB — PROCALCITONIN: Procalcitonin: 4.02 ng/mL

## 2024-07-07 LAB — LACTIC ACID, PLASMA
Lactic Acid, Venous: 2.4 mmol/L (ref 0.5–1.9)
Lactic Acid, Venous: 1.6 mmol/L (ref 0.5–1.9)

## 2024-07-07 LAB — MAGNESIUM: Magnesium: 1.8 mg/dL (ref 1.7–2.4)

## 2024-07-07 LAB — PHOSPHORUS: Phosphorus: 3.7 mg/dL (ref 2.5–4.6)

## 2024-07-07 MED ORDER — OSMOLITE 1.2 CAL PO LIQD
1000.0000 mL | ORAL | Status: DC
  Administered 2024-07-07: 1000 mL
  Filled 2024-07-07: qty 1000

## 2024-07-07 MED ORDER — ENOXAPARIN SODIUM 40 MG/0.4ML IJ SOSY
40.0000 mg | PREFILLED_SYRINGE | Freq: Once | INTRAMUSCULAR | Status: AC
  Administered 2024-07-07: 40 mg via SUBCUTANEOUS
  Filled 2024-07-07: qty 0.4

## 2024-07-07 MED ORDER — VANCOMYCIN HCL 500 MG/100ML IV SOLN: 500.0000 mg | INTRAVENOUS | Status: DC

## 2024-07-07 MED ORDER — IPRATROPIUM BROMIDE 0.02 % IN SOLN: 0.5000 mg | Freq: Four times a day (QID) | RESPIRATORY_TRACT | Status: DC

## 2024-07-07 MED ORDER — ONDANSETRON HCL 4 MG/2ML IJ SOLN: 4.0000 mg | Freq: Four times a day (QID) | INTRAMUSCULAR | Status: DC | PRN

## 2024-07-07 MED ORDER — DEXMEDETOMIDINE HCL IN NACL 400 MCG/100ML IV SOLN
0.0000 ug/kg/h | INTRAVENOUS | Status: DC
  Administered 2024-07-07: 0.4 ug/kg/h via INTRAVENOUS
  Filled 2024-07-07: qty 100

## 2024-07-07 MED ORDER — THIAMINE MONONITRATE 100 MG PO TABS
100.0000 mg | ORAL_TABLET | Freq: Every day | ORAL | Status: DC
  Administered 2024-07-07 – 2024-07-14 (×8): 100 mg
  Filled 2024-07-07 (×7): qty 1

## 2024-07-07 MED ORDER — ORAL CARE MOUTH RINSE
15.0000 mL | OROMUCOSAL | Status: DC
  Administered 2024-07-07 – 2024-07-14 (×72): 15 mL via OROMUCOSAL

## 2024-07-07 MED ORDER — CHLORHEXIDINE GLUCONATE CLOTH 2 % EX PADS
6.0000 | MEDICATED_PAD | Freq: Every day | CUTANEOUS | Status: DC
  Administered 2024-07-07 – 2024-07-14 (×8): 6 via TOPICAL

## 2024-07-07 MED ORDER — LACTATED RINGERS IV SOLN: INTRAVENOUS | Status: AC

## 2024-07-07 MED ORDER — REVEFENACIN 175 MCG/3ML IN SOLN
175.0000 ug | Freq: Every day | RESPIRATORY_TRACT | Status: DC
  Administered 2024-07-07 – 2024-07-14 (×8): 175 ug via RESPIRATORY_TRACT
  Filled 2024-07-07 (×8): qty 3

## 2024-07-07 MED ORDER — ALBUMIN HUMAN 25 % IV SOLN
25.0000 g | Freq: Four times a day (QID) | INTRAVENOUS | Status: AC
  Administered 2024-07-07 – 2024-07-08 (×4): 25 g via INTRAVENOUS
  Filled 2024-07-07 (×4): qty 100

## 2024-07-07 MED ORDER — LACTATED RINGERS IV BOLUS
500.0000 mL | Freq: Once | INTRAVENOUS | Status: AC
  Administered 2024-07-07: 500 mL via INTRAVENOUS

## 2024-07-07 MED ORDER — NOREPINEPHRINE 4 MG/250ML-% IV SOLN
0.0000 ug/min | INTRAVENOUS | Status: DC
  Administered 2024-07-07: 2 ug/min via INTRAVENOUS
  Filled 2024-07-07: qty 250

## 2024-07-07 MED ORDER — SENNA 8.6 MG PO TABS
1.0000 | ORAL_TABLET | Freq: Every day | ORAL | Status: DC
  Administered 2024-07-07 – 2024-07-10 (×4): 8.6 mg via ORAL
  Filled 2024-07-07 (×4): qty 1

## 2024-07-07 MED ORDER — SODIUM CHLORIDE 0.9 % IV SOLN
250.0000 mL | INTRAVENOUS | Status: AC
  Administered 2024-07-07: 250 mL via INTRAVENOUS

## 2024-07-07 MED ORDER — IPRATROPIUM BROMIDE 0.02 % IN SOLN
0.5000 mg | RESPIRATORY_TRACT | Status: DC
  Administered 2024-07-07 (×3): 0.5 mg via RESPIRATORY_TRACT
  Filled 2024-07-07 (×3): qty 2.5

## 2024-07-07 MED ORDER — IPRATROPIUM BROMIDE 0.02 % IN SOLN
RESPIRATORY_TRACT | Status: AC
  Filled 2024-07-07: qty 2.5

## 2024-07-07 MED ORDER — ARFORMOTEROL TARTRATE 15 MCG/2ML IN NEBU
15.0000 ug | INHALATION_SOLUTION | Freq: Two times a day (BID) | RESPIRATORY_TRACT | Status: DC
  Administered 2024-07-07 – 2024-07-14 (×15): 15 ug via RESPIRATORY_TRACT
  Filled 2024-07-07 (×15): qty 2

## 2024-07-07 MED ORDER — FAMOTIDINE 20 MG PO TABS: 20.0000 mg | ORAL_TABLET | Freq: Two times a day (BID) | ORAL | Status: DC

## 2024-07-07 MED ORDER — SODIUM CHLORIDE 0.9 % IV SOLN
2.0000 g | Freq: Two times a day (BID) | INTRAVENOUS | Status: AC
  Administered 2024-07-07 – 2024-07-11 (×10): 2 g via INTRAVENOUS
  Filled 2024-07-07 (×10): qty 12.5

## 2024-07-07 MED ORDER — HYDROCORTISONE SOD SUC (PF) 100 MG IJ SOLR
100.0000 mg | Freq: Two times a day (BID) | INTRAMUSCULAR | Status: DC
  Administered 2024-07-07 (×2): 100 mg via INTRAVENOUS
  Filled 2024-07-07 (×2): qty 2

## 2024-07-07 MED ORDER — ORAL CARE MOUTH RINSE
15.0000 mL | OROMUCOSAL | Status: DC | PRN
Start: 2024-07-07 — End: 2024-07-14

## 2024-07-07 MED ORDER — INSULIN ASPART 100 UNIT/ML IJ SOLN
0.0000 [IU] | INTRAMUSCULAR | Status: DC
  Administered 2024-07-07: 1 [IU] via SUBCUTANEOUS
  Administered 2024-07-07: 3 [IU] via SUBCUTANEOUS
  Administered 2024-07-07: 2 [IU] via SUBCUTANEOUS
  Administered 2024-07-08 – 2024-07-09 (×5): 1 [IU] via SUBCUTANEOUS
  Administered 2024-07-10: 2 [IU] via SUBCUTANEOUS
  Administered 2024-07-10 – 2024-07-11 (×4): 1 [IU] via SUBCUTANEOUS
  Administered 2024-07-11: 2 [IU] via SUBCUTANEOUS
  Administered 2024-07-12 – 2024-07-13 (×6): 1 [IU] via SUBCUTANEOUS

## 2024-07-07 MED ORDER — LORAZEPAM 1 MG PO TABS
0.5000 mg | ORAL_TABLET | Freq: Three times a day (TID) | ORAL | Status: DC | PRN
  Administered 2024-07-08 – 2024-07-09 (×4): 0.5 mg
  Filled 2024-07-07 (×4): qty 1

## 2024-07-07 MED ORDER — MIDODRINE HCL 5 MG PO TABS
5.0000 mg | ORAL_TABLET | Freq: Three times a day (TID) | ORAL | Status: DC
  Administered 2024-07-07 – 2024-07-08 (×3): 5 mg via ORAL
  Filled 2024-07-07 (×3): qty 1

## 2024-07-07 MED ORDER — LEVALBUTEROL HCL 0.63 MG/3ML IN NEBU
0.6300 mg | INHALATION_SOLUTION | Freq: Four times a day (QID) | RESPIRATORY_TRACT | Status: DC
  Administered 2024-07-07 (×2): 0.63 mg via RESPIRATORY_TRACT
  Filled 2024-07-07: qty 3

## 2024-07-07 MED ORDER — PROSOURCE TF20 ENFIT COMPATIBL EN LIQD
60.0000 mL | Freq: Every day | ENTERAL | Status: DC
  Administered 2024-07-07 – 2024-07-14 (×8): 60 mL
  Filled 2024-07-07 (×8): qty 60

## 2024-07-07 MED ORDER — FAMOTIDINE IN NACL 20-0.9 MG/50ML-% IV SOLN
20.0000 mg | Freq: Two times a day (BID) | INTRAVENOUS | Status: DC
  Administered 2024-07-07 – 2024-07-09 (×5): 20 mg via INTRAVENOUS
  Filled 2024-07-07 (×5): qty 50

## 2024-07-07 MED ORDER — VANCOMYCIN HCL 500 MG/100ML IV SOLN
500.0000 mg | INTRAVENOUS | Status: DC
  Administered 2024-07-08: 500 mg via INTRAVENOUS
  Filled 2024-07-07: qty 100

## 2024-07-07 MED ORDER — LATANOPROST 0.005 % OP SOLN
1.0000 [drp] | Freq: Every day | OPHTHALMIC | Status: DC
  Administered 2024-07-07 – 2024-07-13 (×7): 1 [drp] via OPHTHALMIC
  Filled 2024-07-07: qty 2.5

## 2024-07-07 MED ORDER — LABETALOL HCL 5 MG/ML IV SOLN
10.0000 mg | Freq: Once | INTRAVENOUS | Status: DC
  Filled 2024-07-07: qty 4

## 2024-07-07 MED ORDER — SODIUM CHLORIDE 0.9 % IV SOLN: INTRAVENOUS | Status: AC | PRN

## 2024-07-07 MED ORDER — ENOXAPARIN SODIUM 40 MG/0.4ML IJ SOSY: 40.0000 mg | PREFILLED_SYRINGE | Freq: Two times a day (BID) | INTRAMUSCULAR | Status: DC

## 2024-07-07 MED ORDER — ENOXAPARIN SODIUM 30 MG/0.3ML IJ SOSY
30.0000 mg | PREFILLED_SYRINGE | INTRAMUSCULAR | Status: DC
  Administered 2024-07-08 – 2024-07-14 (×7): 30 mg via SUBCUTANEOUS
  Filled 2024-07-07 (×7): qty 0.3

## 2024-07-07 NOTE — Plan of Care (Signed)
  Problem: Coping: Goal: Ability to adjust to condition or change in health will improve Outcome: Progressing   Problem: Fluid Volume: Goal: Ability to maintain a balanced intake and output will improve Outcome: Progressing   Problem: Metabolic: Goal: Ability to maintain appropriate glucose levels will improve Outcome: Progressing   Problem: Nutritional: Goal: Maintenance of adequate nutrition will improve Outcome: Not Progressing Goal: Progress toward achieving an optimal weight will improve Outcome: Progressing   Problem: Skin Integrity: Goal: Risk for impaired skin integrity will decrease Outcome: Progressing   Problem: Tissue Perfusion: Goal: Adequacy of tissue perfusion will improve Outcome: Progressing   Problem: Education: Goal: Knowledge of General Education information will improve Description: Including pain rating scale, medication(s)/side effects and non-pharmacologic comfort measures Outcome: Progressing   Problem: Health Behavior/Discharge Planning: Goal: Ability to manage health-related needs will improve Outcome: Progressing   Problem: Clinical Measurements: Goal: Ability to maintain clinical measurements within normal limits will improve Outcome: Not Progressing Goal: Will remain free from infection Outcome: Not Progressing Goal: Diagnostic test results will improve Outcome: Not Progressing Goal: Respiratory complications will improve Outcome: Progressing Goal: Cardiovascular complication will be avoided Outcome: Progressing   Problem: Activity: Goal: Risk for activity intolerance will decrease Outcome: Progressing   Problem: Nutrition: Goal: Adequate nutrition will be maintained Outcome: Not Progressing   Problem: Coping: Goal: Level of anxiety will decrease Outcome: Progressing   Problem: Elimination: Goal: Will not experience complications related to bowel motility Outcome: Progressing Goal: Will not experience complications related to  urinary retention Outcome: Progressing   Problem: Pain Managment: Goal: General experience of comfort will improve and/or be controlled Outcome: Progressing   Problem: Safety: Goal: Ability to remain free from injury will improve Outcome: Progressing

## 2024-07-07 NOTE — Progress Notes (Signed)
 Pharmacy Antibiotic Note  Dominique Brewer is a 78 y.o. female admitted on 07/06/2024 with pneumonia.  Pharmacy has been consulted for Vancomycin  and Cefepime   dosing.  Plan: Vancomycin  500 mg IV q24h Cefepime  2 g IV q12h   Height: 5' (152.4 cm) Weight: 40.2 kg (88 lb 10 oz) IBW/kg (Calculated) : 45.5  Temp (24hrs), Avg:99 F (37.2 C), Min:98.1 F (36.7 C), Max:99.9 F (37.7 C)  Recent Labs  Lab 07/06/24 2135 07/06/24 2154 07/06/24 2201  WBC  --  17.1*  --   CREATININE 0.35*  --   --   LATICACIDVEN  --   --  1.4    Estimated Creatinine Clearance: 36.8 mL/min (A) (by C-G formula based on SCr of 0.35 mg/dL (L)).    Allergies  Allergen Reactions   Iodine Anaphylaxis   Shellfish Allergy Anaphylaxis    Due to iodine content   Zymaxid [Gatifloxacin] Other (See Comments)    Weakness to legs causing frequent falls; inability to stand or walk   Amlodipine Swelling    Leg and ankle swelling    Aleve [Naproxen] Swelling   Penicillins Other (See Comments)    Fever and rash Tolerated cephalosporins    Adoxa [Doxycycline] Other (See Comments)    Dizziness    Ak-Mycin [Erythromycin] Nausea Only   Benadryl Itch Stopping [Diphenhydramine-Zinc Acetate] Rash    Blistering rash Reaction to topical diphenhydramine    Codeine Other (See Comments)    Unknown reaction    Dominique Brewer 07/07/2024 1:56 AM

## 2024-07-07 NOTE — Progress Notes (Signed)
 PHARMACY - ANTICOAGULATION CONSULT NOTE  Pharmacy Consult for Lovenox  Indication: atrial fibrillation  Allergies  Allergen Reactions   Iodine Anaphylaxis   Shellfish Allergy Anaphylaxis    Due to iodine content   Zymaxid [Gatifloxacin] Other (See Comments)    Weakness to legs causing frequent falls; inability to stand or walk   Amlodipine Swelling    Leg and ankle swelling    Aleve [Naproxen] Swelling   Penicillins Other (See Comments)    Fever and rash Tolerated cephalosporins    Adoxa [Doxycycline] Other (See Comments)    Dizziness    Ak-Mycin [Erythromycin] Nausea Only   Benadryl Itch Stopping [Diphenhydramine-Zinc Acetate] Rash    Blistering rash Reaction to topical diphenhydramine    Codeine Other (See Comments)    Unknown reaction    Patient Measurements: Height: 5' (152.4 cm) Weight: 40.2 kg (88 lb 10 oz) IBW/kg (Calculated) : 45.5  Vital Signs: Temp: 98.1 F (36.7 C) (07/14 0104) Temp Source: Axillary (07/14 0104) BP: 175/77 (07/14 0130) Pulse Rate: 108 (07/14 0130)  Labs: Recent Labs    07/06/24 2135 07/06/24 2154 07/06/24 2308  HGB  --  11.4* 11.6*  HCT  --  35.9* 34.0*  PLT  --  341  --   CREATININE 0.35*  --   --     Estimated Creatinine Clearance: 36.8 mL/min (A) (by C-G formula based on SCr of 0.35 mg/dL (L)).   Medical History: Past Medical History:  Diagnosis Date   Respiratory failure (HCC)     Medications:  No current facility-administered medications on file prior to encounter.   Current Outpatient Medications on File Prior to Encounter  Medication Sig Dispense Refill   acetaminophen  (TYLENOL ) 325 MG tablet Place 650 mg into feeding tube every 6 (six) hours as needed (pain).     carboxymethylcellulose (REFRESH PLUS) 0.5 % SOLN Place 1 drop into both eyes every 6 (six) hours as needed (dry eye).     docusate (COLACE) 50 MG/5ML liquid Place 10 mLs (100 mg total) into feeding tube 2 (two) times daily.     enoxaparin  (LOVENOX ) 30  MG/0.3ML injection Inject 0.3 mLs (30 mg total) into the skin daily.     guaiFENesin 200 MG tablet Place 200 mg into feeding tube every 6 (six) hours as needed for cough or to loosen phlegm.     lansoprazole (PREVACID SOLUTAB) 30 MG disintegrating tablet Take 30 mg by mouth daily.     latanoprost  (XALATAN ) 0.005 % ophthalmic solution Place 1 drop into both eyes at bedtime.     melatonin 1 MG TABS tablet Take 1 mg by mouth at bedtime as needed (sleep).     Menthol-Zinc Oxide (CALMOSEPTINE) 0.44-20.6 % OINT Apply 1 application  topically See admin instructions. Apply a thin layer topically to sacral region for incontinence episodes, three times daily on every shift.     midodrine  (PROAMATINE ) 5 MG tablet Place 3 tablets (15 mg total) into feeding tube every 8 (eight) hours.     Multiple Vitamin (MULTIVITAMIN) tablet Place 1 tablet into feeding tube daily.     Nutritional Supplements (FEEDING SUPPLEMENT, OSMOLITE 1.5 CAL,) LIQD Place 1,430-1,560 mLs into feeding tube continuous. May be off for up to 2 hours daily for ADL's and activities. Total for day 1430-1531mL's (7854-6759 kCal)     thiamine  (VITAMIN B1) 100 MG tablet Place 100 mg into feeding tube daily.     Zinc Sulfate 220 (50 Zn) MG TABS Give 1 tablet by tube daily.  Assessment: 78 y.o. female with new onset Afib for Lovenox  Goal of Therapy:  Anti-Xa level 0.6-1 units/ml 4hrs after LMWH dose given Monitor platelets by anticoagulation protocol: Yes   Plan:  Lovenox  40 mg SQ q12h  [name] 07/07/2024,1:58 AM

## 2024-07-07 NOTE — Progress Notes (Signed)
 Initial Nutrition Assessment  DOCUMENTATION CODES:   Underweight, Severe malnutrition in context of chronic illness  INTERVENTION:   Discussed nutrition poc with Dr. Claudene. Ok to initiate TF today. Plan to start trickle only today. Assess for titration to goal on follow-up  Tube Feeding via PEG:  Osmolite 1.2 at 20 ml/hr only today TF goal: Osmolite 1.2 at 50 ml/hr with Pro-Source TF20 60 ml daily TF at goal provides 1520 kcals, 972 mL of free water   Once fluid resuscitated/hemodynamically stable, recommend additional free water  flushes of 150 mL q 6 hours to meet hydration needs  Monitor electrolytes  NUTRITION DIAGNOSIS:   Severe Malnutrition related to chronic illness as evidenced by severe fat depletion, severe muscle depletion.  GOAL:   Patient will meet greater than or equal to 90% of their needs  MONITOR:   TF tolerance, Labs, Weight trends, Skin, I & O's  REASON FOR ASSESSMENT:   Consult, Ventilator Enteral/tube feeding initiation and management  ASSESSMENT:   78 yo female admitted with septic shock secondary to pneumonia, acute on chronic respiratory failure on vent via trach. PMH includes ALS s/p trach/vent and PEG with dysphagia, HTN, HLD, CAD, FTT, depression, a.fib with RVR, chronic wound  7/13 Admitted  Pt remains on vent with trach. Alert, able to shake head to yes/no questions, attempting to mouth words  +hypothermia (temp 32.3-rectal), +hypotension this AM, MAPs in 50s. 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 Unclear if pt has been receiving full TF prescription and exactly  what the TF prescription has been recently. Noted orders for both Osmolite 1.5 (total volume of 1430-1560 mL-2145-3240 cakes) and Jevity 1.5 (1210-1320 kcals-1815-1980 kcals) in home med list. Receiving MVI, thiamine , zinc sulfate as well  Current wt 40.2 kg; based on weight encounters, weight down since admisson in June. FTT with hx of severe malnutrition  Labs: Creatinine <0.30 (L) Phosphorus 3.7 (wdl) Magnesium  1.8 (Wdl) Potassium 4.0 (wdl) CBGs 131-215  Meds:  SS novolog   NUTRITION - FOCUSED PHYSICAL EXAM: Some degree of muscle wasting likely related to ALS; this should not account for the severity of subcutaneous fat loss  Flowsheet Row Most Recent Value  Orbital Region Severe depletion  Upper Arm Region Severe depletion  Thoracic and Lumbar Region Unable to assess  Buccal Region Severe depletion  Temple Region Severe depletion  Clavicle Bone Region Severe depletion  Clavicle and Acromion Bone Region Severe depletion  Scapular Bone Region Severe depletion  Dorsal Hand Unable to assess  Patellar Region Severe depletion  Anterior Thigh Region Severe depletion  Posterior Calf Region Severe depletion  Edema (RD Assessment) None     Diet Order:   Diet Order             Diet NPO time specified  Diet effective now                   EDUCATION NEEDS:   Not appropriate for education at this time  Skin:  Skin Assessment: Skin Integrity Issues: Skin Integrity Issues:: Stage III Stage III: sacrum, chronic, improving per [facility]  RN assessment (last seen in June 2025)  Last BM:  7/14  Height:   Ht Readings from Last 1 Encounters:  07/07/24 5' (1.524 m)    Weight:   Wt Readings from Last 1 Encounters:  07/07/24 40.2 kg     BMI:  Body mass index is 17.31 kg/m.  Estimated Nutritional Needs:   Kcal:  1450-1650 kcals  Protein:  75-85 g  Fluid:  >/= 1.5 L    Betsey Finger MS, RDN, LDN, CNSC Registered Dietitian 3 Clinical Nutrition RD Inpatient Contact  Info in Amion

## 2024-07-07 NOTE — Progress Notes (Signed)
 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  HTN, HLD, CAD, Dysphagia, failure to thrive and depression.  She has a h/o A fib and was in A fib with RVR on admission started on Cardizem  drip.  Cxr showing pneumonia.  Normal lactic acid and increased WBC 17, Cr 0.35.  She has a h/o stenotrophomonas maltophilia lung infection. She is normally on 40% FIO2 but currently on 60% FIO2 and c/o SOB.  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  HTN, HLD, CAD, Dysphagia, failure to thrive and depression.  Significant Hospital Events: Including procedures, antibiotic start and stop dates in addition to other pertinent events   7/13: Admit to ICU  Interim History / Subjective:  N/a  Objective    Blood pressure (!) 71/46, pulse (!) 56, temperature (!) 90.5 F (32.5 C), resp. rate (!) 26, height 5' (1.524 m), weight 40.2 kg, SpO2 94%.    Vent Mode: PRVC FiO2 (%):  [40 %-50 %] 40 % Set Rate:  [16 bmp-26 bmp] 26 bmp Vt Set:  [300 mL-360 mL] 360 mL PEEP:  [5 cmH20] 5 cmH20 Plateau Pressure:  [17 cmH20] 17 cmH20   Intake/Output Summary (Last 24 hours) at 07/07/2024 1048 Last data filed at 07/07/2024 0700 Gross per 24 hour  Intake 1194.31 ml  Output --  Net 1194.31 ml   Filed Weights   07/07/24 0143  Weight: 40.2 kg    Examination:  General: acute on chronic cathectic female, lying in icu bed on vent  HEENT: Normocephalic, PERRLA intact, trache/vent, Pink MM CV: s1,s2, RRR, no MRG, No JVD  pulm: clear, diminished, no distress on vent  Abs: bs active, soft  Extremities: malnourished, no edema, no deformity, moves all extremities on command Skin: no rash  Neuro: Rass 0 to -1, follows commands, orientation  questionable  GU: foley intact    Resolved problem list   Assessment and Plan  Septic Shock- suspect in setting of pna  Per records, and previous hospitalizations- SBP ~ 150s  Waiting to obtain recent records from [facility] 05/26/24 ECHO 70-75% EF, LV/RV normal, LV hyperdynamic function, no regional wall abnormalities  7/14 Beside spot ECHO- overall normal LV/RV function, IVC did not appear to be down/collapsible  P:  Continue vanc and cefepime  for now  Start levophed  gtt  Add stress dose steroids  Continue to follow bc Obtain tracheal aspirate  Obtain urinalysis- strep pna, legionella  Continue LR fluids at 14ml/hr  Repeat lactic acid, obtain pro calcitonin   Acute on chronic hypoxic respiratory failure Ventilator Associated pneumonia with h/o stenotrophomonas maltophilia lung infection ( treated with bactrim in April/2025)  Acute bronchospasm secondary to pneumonia H/o Trach and chronic vent Chest X-ray- concern for pna  P:  Continue ventilator support and lung protective strategies  Continue LTVV  Wean PEEP and Fio2 requirements to sat goal of >92%  HOB > 30 degrees Plat < 30  Aim for Driving pressures < 15  Intermittent Chest X-ray and ABGS Obtain and follow cultures-blood and tracheal aspirate VAP and PAD protocols in place  Continue Nebs with Xopenex  and Atrovent  Continue cefepime  and vanc   A fib with RVR - originally needing cardizem  currently sinus rhythm,  sinus brady  P:  Dc cardizem  due to hypotension  Continue cardiac tele  Switch full dose lovenox  to DVT prophylaxis  Obtain TSH  H/o PEG P: Continue tube feedings  H/o ALS P: Continue supportive care   Best Practice (right click and Reselect all SmartList Selections daily)   Diet/type: tubefeeds DVT prophylaxis prophylactic heparin  Pressure ulcer(s): N/A GI prophylaxis: H2B Lines: N/A Foley:  Yes, and it is still needed Code Status:  full code   Critical care time: 40 mins    Christian  Cannie Muckle AGACNP-BC   [facility] 07/07/2024, 11:26 AM  Please see [email or web address] for pager details.  From 7A-7P if no response, please call [phone]. After hours, please call ELink [phone].

## 2024-07-07 NOTE — Progress Notes (Addendum)
 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  Presents with acute on chronic hypoxic respiratory failure Secondary to stenotrophomonas.  Presents with tachypnea, tachycardia, and hypotension saturating 95% on tracheostomy and vent.  Persistent tachycardia despite diltiazem .  Results show adequate ventilation and oxygenation, adequate electrolytes with some transaminitis.  Leukocytosis present.  Infiltrates present on chest radiograph.  eICU Interventions  Maintain broad-spectrum antibiotics, cultures pending, maintain scheduled SVNs  Daily vent liberation trial as tolerated.  Diltiazem  infusion, given persistent tachycardia, add fluid bolus, one-time labetalol  push  Limited IV access, may benefit from PICC vs port   Consider DVT chemoprophylaxis, SCDs GI prophylaxis with famotidine    0312 -mildly worsening respiratory acidosis with pH 7.24.  Increased set respiratory rate to 26  0511 -despite adjustments, patient has refractory acidemia.  Remains fairly synchronous.  Would like to increase tidal volume to 8 cc/kg (360)       Benjermin Korber 07/07/2024, 1:29 AM

## 2024-07-07 NOTE — Consult Note (Addendum)
 WOC Nurse Consult Note: Consult requested for healing sacrum wound.  Performed remotely after review of progress notes and photos in the EMR.  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 Pt is critically ill with multiple systemic factors which can impair healing.  They are on a low airloss mattress to reduce pressure.  Pressure Injury POA: Yes Measurement: 8X3X.1cm, according to bedside nurses' wound care flow sheet across bilat buttocks and sacrum.  Dry red with scattered scabbed areas and patchy areas of white maceration related to moisture.   Topical treatment orders provided for bedside nurses to perform as follows:   Apply Xeroform gauze to sacrum/bilat buttocks Q day and cover with foam dressing.  Change foam dressing Q 3 days or PRN soiling Please re-consult if further assistance is needed.  Thank-you,  Stephane Fought MSN, RN, CWOCN, [place], CNS [phone]

## 2024-07-08 ENCOUNTER — Inpatient Hospital Stay (HOSPITAL_COMMUNITY)

## 2024-07-08 DIAGNOSIS — I4891 Unspecified atrial fibrillation: Secondary | ICD-10-CM | POA: Diagnosis not present

## 2024-07-08 DIAGNOSIS — R6521 Severe sepsis with septic shock: Secondary | ICD-10-CM | POA: Diagnosis not present

## 2024-07-08 DIAGNOSIS — J9621 Acute and chronic respiratory failure with hypoxia: Secondary | ICD-10-CM | POA: Diagnosis not present

## 2024-07-08 DIAGNOSIS — A419 Sepsis, unspecified organism: Secondary | ICD-10-CM | POA: Diagnosis not present

## 2024-07-08 DIAGNOSIS — I4711 Inappropriate sinus tachycardia, so stated: Secondary | ICD-10-CM | POA: Diagnosis not present

## 2024-07-08 LAB — CBC
HCT: 27.2 % — ABNORMAL LOW (ref 36.0–46.0)
Hemoglobin: 8.7 g/dL — ABNORMAL LOW (ref 12.0–15.0)
MCH: 28 pg (ref 26.0–34.0)
MCHC: 32 g/dL (ref 30.0–36.0)
MCV: 87.5 fL (ref 80.0–100.0)
Platelets: 278 K/uL (ref 150–400)
RBC: 3.11 MIL/uL — ABNORMAL LOW (ref 3.87–5.11)
RDW: 15.1 % (ref 11.5–15.5)
WBC: 14.8 K/uL — ABNORMAL HIGH (ref 4.0–10.5)
nRBC: 0 % (ref 0.0–0.2)

## 2024-07-08 LAB — RENAL FUNCTION PANEL
Albumin: 3.4 g/dL — ABNORMAL LOW (ref 3.5–5.0)
Anion gap: 10 (ref 5–15)
BUN: 20 mg/dL (ref 8–23)
CO2: 26 mmol/L (ref 22–32)
Calcium: 9.7 mg/dL (ref 8.9–10.3)
Chloride: 102 mmol/L (ref 98–111)
Creatinine, Ser: 0.3 mg/dL — ABNORMAL LOW (ref 0.44–1.00)
GFR, Estimated: >60 mL/min (ref >60)
Glucose, Bld: 118 mg/dL — ABNORMAL HIGH (ref 70–99)
Phosphorus: 3 mg/dL (ref 2.5–4.6)
Potassium: 3.7 mmol/L (ref 3.5–5.1)
Sodium: 138 mmol/L (ref 135–145)

## 2024-07-08 LAB — GLUCOSE, CAPILLARY
Glucose-Capillary: 141 mg/dL — ABNORMAL HIGH (ref 70–99)
Glucose-Capillary: 91 mg/dL (ref 70–99)
Glucose-Capillary: 103 mg/dL — ABNORMAL HIGH (ref 70–99)

## 2024-07-08 LAB — MAGNESIUM: Magnesium: 1.8 mg/dL (ref 1.7–2.4)

## 2024-07-08 MED ORDER — MIDODRINE HCL 5 MG PO TABS: 5.0000 mg | ORAL_TABLET | Freq: Three times a day (TID) | ORAL | Status: DC

## 2024-07-08 MED ORDER — HYDROXYZINE HCL 10 MG/5ML PO SYRP
10.0000 mg | ORAL_SOLUTION | Freq: Three times a day (TID) | ORAL | Status: DC | PRN
  Administered 2024-07-09 – 2024-07-12 (×4): 10 mg via ORAL
  Filled 2024-07-08 (×5): qty 5

## 2024-07-08 MED ORDER — OSMOLITE 1.2 CAL PO LIQD
1000.0000 mL | ORAL | Status: DC
  Administered 2024-07-08 – 2024-07-14 (×5): 1000 mL
  Filled 2024-07-08 (×9): qty 1000

## 2024-07-08 MED ORDER — MIDODRINE HCL 5 MG PO TABS: 5.0000 mg | ORAL_TABLET | Freq: Two times a day (BID) | ORAL | Status: DC

## 2024-07-08 NOTE — Progress Notes (Addendum)
 TOC received consult for point of contact for patient. RN informed CSW that patient is now alert and patient informed RN that she doesn't have a healthcare POA but she does have a sister Dominique Brewer who is point of contact, and is now listed in patients chart. CSW will clear consult. Please reconsult TOC for assist with any additional patient needs.

## 2024-07-08 NOTE — Progress Notes (Addendum)
 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  HTN, HLD, CAD, Dysphagia, failure to thrive and depression.  She has a h/o A fib and was in A fib with RVR on admission started on Cardizem  drip.  Cxr showing pneumonia.  Normal lactic acid and increased WBC 17, Cr 0.35.  She has a h/o stenotrophomonas maltophilia lung infection. She is normally on 40% FIO2 but currently on 60% FIO2 and c/o SOB.  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  HTN, HLD, CAD, Dysphagia, failure to thrive and depression.  Significant Hospital Events: Including procedures, antibiotic start and stop dates in addition to other pertinent events   7/13: Admit to ICU 7/14: dilt stopped for hypotension, x2 bolus. Levo started and stopped  7/15: back on 40% FiO2, wbc down, dc vanc and steroids. [facility] to see as patient does not want to return to [facility]   Interim History / Subjective:  NAEON  Objective    Blood pressure (!) 153/93, pulse 98, temperature (!) 97.5 F (36.4 C), resp. rate (!) 27, height 5' (1.524 m), weight 40.2 kg, SpO2 (!) 89%.    Vent Mode: PRVC FiO2 (%):  [40 %] 40 % Set Rate:  [26 bmp] 26 bmp Vt Set:  [360 mL] 360 mL PEEP:  [5 cmH20] 5 cmH20   Intake/Output Summary (Last 24 hours) at 07/08/2024 0743 Last data filed at 07/08/2024 9395 Gross per 24 hour  Intake 3584.05 ml  Output 1350 ml  Net 2234.05 ml   Filed Weights   07/07/24 0143 07/08/24 0500  Weight: 40.2 kg 40.2 kg    Examination: General: chronically ill appearing, thin female laying in bed NAD  HEENT: ncat, perrla, mmm, trach CV: s1,s2, RRR  pulm: coarse lung sounds, rhonchi R>L, vented, synchronous  Abs: bs active, soft  Extremities: malnourished, no  edema, no deformity, moves all extremities on command Skin: no rash  Neuro: awake, alert, follows commands  GU: foley intact    Resolved problem list   Assessment and Plan  Septic Shock 2/2 pneumonia R>L Per records, and previous hospitalizations- SBP ~ 150s  05/26/24 ECHO 70-75% EF, LV/RV normal, LV hyperdynamic function, no regional wall abnormalities  7/14 Beside spot ECHO- overall normal LV/RV function, IVC did not appear to be down/collapsible  Procal 4. Resp panel negative. Ua negative. CXR which shows R>L pneumonia  P:  - discontinue vancomycin , continue cefepime  for now  - off pressor. Dc steroids today  - continue midodrine  5mg  TID - bcultures ngtd  - still need to get tracheal aspirate, follow culture data; f/u strep pna and legionella, both pending  - lactic cleared, stop trending   Acute on chronic hypoxic respiratory failure Ventilator Associated pneumonia with h/o stenotrophomonas maltophilia lung infection ( treated with bactrim in April/2025)  Acute bronchospasm secondary to pneumonia H/o Trach and chronic vent Chest X-ray- concern for pna  P:  - back to PTA vent settings  - f/u tracheal aspirate, continue cefepime   - con't brovana , yupelri   - full mechanical vent support - lung protective ventilation 6-8cc/kg Vt - VAP and PAD bundle in place  - titrate FiO2 to sat goal >92  - maintain peak/plats <30, driving pressures <  15    A fib with RVR - originally needing cardizem  currently sinus rhythm, sinus brady. TSH WNL. Was on cardizem  but dc due to hypotension.  P:  - tele monitoring  - lovenox    H/o PEG P: Continue tube feedings Continue thiamine   H/o ALS P: Continue supportive care   [facility] today, patient requesting.   Best Practice (right click and Reselect all SmartList Selections daily)   Diet/type: tubefeeds DVT prophylaxis LMWH Pressure ulcer(s): present on admission  GI prophylaxis: H2B Lines:  N/A Foley:  Yes, and it is still needed Code Status:  full code   Critical care time: 84   Tinnie FORBES Furth, PA-C [facility] 07/08/24 7:47 AM  Please see [email or web address] for pager details.  From 7A-7P if no response, please call [phone] After hours, please call ELink [phone]

## 2024-07-08 NOTE — Plan of Care (Signed)
  Problem: Coping: Goal: Ability to adjust to condition or change in health will improve Outcome: Progressing   Problem: Fluid Volume: Goal: Ability to maintain a balanced intake and output will improve Outcome: Progressing   Problem: Metabolic: Goal: Ability to maintain appropriate glucose levels will improve Outcome: Progressing   Problem: Nutritional: Goal: Maintenance of adequate nutrition will improve Outcome: Progressing   Problem: Tissue Perfusion: Goal: Adequacy of tissue perfusion will improve Outcome: Progressing   Problem: Clinical Measurements: Goal: Will remain free from infection Outcome: Progressing Goal: Respiratory complications will improve Outcome: Progressing Goal: Cardiovascular complication will be avoided Outcome: Progressing

## 2024-07-08 NOTE — Plan of Care (Signed)

## 2024-07-08 NOTE — Progress Notes (Signed)
 eLink Physician-Brief Progress Note Patient Name: Dominique Brewer DOB: 1946-05-11 MRN: [id number]   Date of Service  07/08/2024  HPI/Events of Note  patient is very anxious and fixated that she has a bowel obstruction and can't use the bathroom, on senna, disimpaction yesterday with soft contents  eICU Interventions  Obtain abdominal film     Intervention Category Minor Interventions: Clinical assessment - ordering diagnostic tests  Jovoni Borkenhagen 07/08/2024, 8:19 PM

## 2024-07-09 ENCOUNTER — Inpatient Hospital Stay (HOSPITAL_COMMUNITY)

## 2024-07-09 DIAGNOSIS — A419 Sepsis, unspecified organism: Secondary | ICD-10-CM | POA: Diagnosis not present

## 2024-07-09 DIAGNOSIS — J9621 Acute and chronic respiratory failure with hypoxia: Secondary | ICD-10-CM | POA: Diagnosis not present

## 2024-07-09 DIAGNOSIS — R6521 Severe sepsis with septic shock: Secondary | ICD-10-CM | POA: Diagnosis not present

## 2024-07-09 DIAGNOSIS — I4891 Unspecified atrial fibrillation: Secondary | ICD-10-CM

## 2024-07-09 LAB — CBC
HCT: 34.9 % — ABNORMAL LOW (ref 36.0–46.0)
Hemoglobin: 11.1 g/dL — ABNORMAL LOW (ref 12.0–15.0)
MCH: 27.8 pg (ref 26.0–34.0)
MCHC: 31.8 g/dL (ref 30.0–36.0)
MCV: 87.5 fL (ref 80.0–100.0)
Platelets: 342 K/uL (ref 150–400)
RBC: 3.99 MIL/uL (ref 3.87–5.11)
RDW: 15.4 % (ref 11.5–15.5)
WBC: 13.3 K/uL — ABNORMAL HIGH (ref 4.0–10.5)
nRBC: 0 % (ref 0.0–0.2)

## 2024-07-09 LAB — GLUCOSE, CAPILLARY
Glucose-Capillary: 104 mg/dL — ABNORMAL HIGH (ref 70–99)
Glucose-Capillary: 116 mg/dL — ABNORMAL HIGH (ref 70–99)
Glucose-Capillary: 126 mg/dL — ABNORMAL HIGH (ref 70–99)
Glucose-Capillary: 130 mg/dL — ABNORMAL HIGH (ref 70–99)
Glucose-Capillary: 131 mg/dL — ABNORMAL HIGH (ref 70–99)
Glucose-Capillary: 134 mg/dL — ABNORMAL HIGH (ref 70–99)
Glucose-Capillary: 169 mg/dL — ABNORMAL HIGH (ref 70–99)

## 2024-07-09 LAB — RENAL FUNCTION PANEL
Albumin: 3.3 g/dL — ABNORMAL LOW (ref 3.5–5.0)
Anion gap: 11 (ref 5–15)
BUN: 22 mg/dL (ref 8–23)
CO2: 28 mmol/L (ref 22–32)
Calcium: 9.4 mg/dL (ref 8.9–10.3)
Chloride: 100 mmol/L (ref 98–111)
Creatinine, Ser: 0.31 mg/dL — ABNORMAL LOW (ref 0.44–1.00)
GFR, Estimated: >60 mL/min (ref >60)
Glucose, Bld: 142 mg/dL — ABNORMAL HIGH (ref 70–99)
Phosphorus: 2 mg/dL — ABNORMAL LOW (ref 2.5–4.6)
Potassium: 3.1 mmol/L — ABNORMAL LOW (ref 3.5–5.1)
Sodium: 139 mmol/L (ref 135–145)

## 2024-07-09 LAB — LACTIC ACID, PLASMA
Lactic Acid, Venous: 2.3 mmol/L (ref 0.5–1.9)
Lactic Acid, Venous: 2.4 mmol/L (ref 0.5–1.9)

## 2024-07-09 LAB — LEGIONELLA PNEUMOPHILA SEROGP 1 UR AG: L. pneumophila Serogp 1 Ur Ag: NEGATIVE

## 2024-07-09 LAB — MAGNESIUM: Magnesium: 1.8 mg/dL (ref 1.7–2.4)

## 2024-07-09 LAB — TROPONIN I (HIGH SENSITIVITY)
Troponin I (High Sensitivity): 11 ng/L (ref <18)
Troponin I (High Sensitivity): 12 ng/L (ref <18)

## 2024-07-09 LAB — PHOSPHORUS: Phosphorus: 2 mg/dL — ABNORMAL LOW (ref 2.5–4.6)

## 2024-07-09 MED ORDER — POTASSIUM CHLORIDE 20 MEQ PO PACK
40.0000 meq | PACK | Freq: Once | ORAL | Status: AC
  Administered 2024-07-09: 40 meq
  Filled 2024-07-09: qty 2

## 2024-07-09 MED ORDER — HYDROMORPHONE HCL 1 MG/ML IJ SOLN
0.5000 mg | INTRAMUSCULAR | Status: DC | PRN
  Administered 2024-07-09: 1 mg via INTRAVENOUS
  Filled 2024-07-09: qty 1

## 2024-07-09 MED ORDER — LACTATED RINGERS IV BOLUS
1000.0000 mL | Freq: Once | INTRAVENOUS | Status: AC
  Administered 2024-07-10: 1000 mL via INTRAVENOUS

## 2024-07-09 MED ORDER — POTASSIUM PHOSPHATES 15 MMOLE/5ML IV SOLN
15.0000 mmol | Freq: Once | INTRAVENOUS | Status: AC
  Administered 2024-07-09: 15 mmol via INTRAVENOUS
  Filled 2024-07-09: qty 5

## 2024-07-09 MED ORDER — DEXMEDETOMIDINE HCL IN NACL 400 MCG/100ML IV SOLN
0.0000 ug/kg/h | INTRAVENOUS | Status: DC
  Administered 2024-07-09: 0.4 ug/kg/h via INTRAVENOUS
  Filled 2024-07-09: qty 100

## 2024-07-09 MED ORDER — MAGNESIUM SULFATE 2 GM/50ML IV SOLN
2.0000 g | Freq: Once | INTRAVENOUS | Status: AC
  Administered 2024-07-09: 2 g via INTRAVENOUS
  Filled 2024-07-09: qty 50

## 2024-07-09 MED ORDER — LACTATED RINGERS IV SOLN: INTRAVENOUS | Status: AC

## 2024-07-09 MED ORDER — ACETAMINOPHEN 325 MG PO TABS
650.0000 mg | ORAL_TABLET | ORAL | Status: DC | PRN
  Administered 2024-07-09 – 2024-07-11 (×2): 650 mg
  Filled 2024-07-09 (×2): qty 2

## 2024-07-09 MED ORDER — GERHARDT'S BUTT CREAM
TOPICAL_CREAM | CUTANEOUS | Status: DC | PRN
  Administered 2024-07-12: 1 via TOPICAL
  Filled 2024-07-09: qty 60

## 2024-07-09 MED ORDER — IPRATROPIUM-ALBUTEROL 0.5-2.5 (3) MG/3ML IN SOLN
3.0000 mL | RESPIRATORY_TRACT | Status: DC | PRN
  Administered 2024-07-09: 3 mL via RESPIRATORY_TRACT

## 2024-07-09 MED ORDER — HYDRALAZINE HCL 20 MG/ML IJ SOLN
10.0000 mg | Freq: Four times a day (QID) | INTRAMUSCULAR | Status: DC | PRN
  Administered 2024-07-09 – 2024-07-10 (×2): 10 mg via INTRAVENOUS
  Filled 2024-07-09 (×2): qty 1

## 2024-07-09 MED ORDER — FAMOTIDINE 40 MG/5ML PO SUSR
20.0000 mg | Freq: Every day | ORAL | Status: DC
  Administered 2024-07-10 – 2024-07-14 (×5): 20 mg
  Filled 2024-07-09 (×6): qty 2.5

## 2024-07-09 MED ORDER — LACTATED RINGERS IV BOLUS
1000.0000 mL | Freq: Once | INTRAVENOUS | Status: AC
  Administered 2024-07-09: 1000 mL via INTRAVENOUS

## 2024-07-09 NOTE — TOC Initial Note (Addendum)
 Transition of Care [facility]) - Initial/Assessment Note    Patient Details  Name: Dominique Brewer MRN: [id number] Date of Birth: [birth year]  [facility] CM/SW Contact:    Justina Delcia Czar, RN Phone Number: [phone] 07/09/2024, 9:30 AM  Clinical Narrative:                 Patient was able to communicate by writing. States she does not want to go back to [facility]. States she wants to go to [facility] in [place] or home with caregivers. She would need 24/7 hour caregiver, [facility] LTC may not accept chronic vent pt. Will follow up with [facility] rep, Eric. States they will be able to accept patient back at dc. Updated TOC CSW to follow up with [facility]. Pt may need to go back to [facility] and they assist with her transferring to another facility.   Will continue to follow up for dc needs.     Expected Discharge Plan: Skilled Nursing Facility Barriers to Discharge: Continued Medical Work up   Patient Goals and CMS Choice Patient states their goals for this hospitalization and ongoing recovery are:: want to get better CMS Medicare.gov Compare Post Acute Care list provided to:: Patient Choice offered to / list presented to : Patient      Expected Discharge Plan and Services   Discharge Planning Services: CM Consult Post Acute Care Choice: Long Term Acute Care (LTAC)                                        Prior Living Arrangements/Services   Lives with:: Facility Resident Patient language and need for interpreter reviewed:: No Do you feel safe going back to the place where you live?: No   wants a different facility  Need for Family Participation in Patient Care: Yes (Comment) Care giver support system in place?: Yes (comment)   Criminal Activity/Legal Involvement Pertinent to Current Situation/Hospitalization: No - Comment as needed  Activities of Daily Living   ADL Screening (condition at time of admission) Independently performs  ADLs?: No Does the patient have a NEW difficulty with bathing/dressing/toileting/self-feeding that is expected to last >3 days?: No Does the patient have a NEW difficulty with getting in/out of bed, walking, or climbing stairs that is expected to last >3 days?: No Does the patient have a NEW difficulty with communication that is expected to last >3 days?: No Is the patient deaf or have difficulty hearing?: No Does the patient have difficulty seeing, even when wearing glasses/contacts?: No Does the patient have difficulty concentrating, remembering, or making decisions?: No  Permission Sought/Granted Permission sought to share information with : Case Manager, PCP, Magazine features editor, Family Supports Permission granted to share information with : Yes, Verbal Permission Granted              Emotional Assessment Appearance:: Appears stated age Attitude/Demeanor/Rapport: Engaged Affect (typically observed): Guarded Orientation: : Oriented to Self, Oriented to Place, Oriented to  Time, Oriented to Situation   Psych Involvement: No (comment)  Admission diagnosis:  Acute and chronic respiratory failure (acute-on-chronic) (HCC) [J96.20] Atrial fibrillation with RVR (HCC) [I48.91] Severe sepsis (HCC) [A41.9, R65.20] Patient Active Problem List   Diagnosis Date Noted   Acute and chronic respiratory failure (acute-on-chronic) (HCC) 07/07/2024   Hypotension due to drugs 05/30/2024   Pressure injury of skin 05/30/2024   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 [place]  [address] Phone: [phone] Fax: [phone]     Social Drivers of Health (SDOH) Social History: SDOH Screenings   Food Insecurity: Patient Unable To Answer (07/07/2024)  Housing: Unknown (07/07/2024)  Transportation Needs: Patient Unable To Answer (07/07/2024)  Utilities: Patient Unable To Answer (07/07/2024)  Financial Resource Strain: High Risk (06/08/2023)   Received from [facility]  Physical Activity: Unknown (12/08/2022)   Received from [facility]  Social Connections: Patient Unable To Answer (07/07/2024)  Stress: No Stress Concern Present (12/08/2022)   Received from [facility]  Tobacco Use: Low Risk  (07/06/2024)   SDOH Interventions: Food Insecurity Interventions: Patient Unable to Answer Housing Interventions: Patient Unable to Answer Transportation Interventions: Patient Unable to Answer Utilities Interventions: Patient Unable to Answer Social Connections Interventions: Patient Unable to Answer   Readmission Risk Interventions     No data to display

## 2024-07-09 NOTE — Progress Notes (Signed)
 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  She has a h/o A fib and was in A fib with RVR on admission started on Cardizem  drip.  Cxr showing pneumonia.  Normal lactic acid and increased WBC 17, Cr 0.35.  She has a h/o stenotrophomonas maltophilia lung infection. She is on 40% FiO2 and PEEP of 5 on AC mode on mechanical ventilation.   Assessment and Plan:  Septic shock,POA: Resolved now Etiology appears to be possible bilateral bacterial pneumonia Off of pressors and stress dose steroids Midodrine  was started but dced on 7/16 because of elevated BP. No growth to date on blood cultures Dced vanc. Continue with IV Cefepime  Unremarkable respiratory panel and UA  Atrial fibrillation with RVR,POA: S/p cardizem  drip. She is in sinus rhythm now. TSH-Normal  Acute on chronic hypoxic respiratory failure in the setting of chronic trach and vent dependency,POA: She is back on her baseline FiO2 of 40%, PEEP 5 on AC mode Continue with inhalational bronchodilator therapy Goal sat 92% and above Monitor Peak and Plateau pressures  ALS,POA: Continue with supportive care.  Nutrition: PEG tube in place and currently on tube feeds. No significant residuals.  Moderate protein calorie malnutrition: As evidenced by low albumin  and low BMI. On tube feeds  DVT prophylaxis: Lovenox   GI prophylaxis: Pepcid   Disposition: She is unwilling to go back to [facility]. [facility] will evaluate for admission. Case management on board.        Subjective: Chronic trach, on vent. Tube feedings going on via PEG tube with no issues. BP on the higher side so midodrine  was held this morning.She communicates by writing on a piece of paper but  currently she is too tired to do that. Discussed with nurse at the bedside.  Physical Exam: Vitals:   07/09/24 0800 07/09/24 0827 07/09/24 0832 07/09/24 0900  BP: 129/80  137/79 (!) 149/81  Pulse: 91  96 88  Resp: (!) 26  (!) 27 (!) 22  Temp:      TempSrc:      SpO2: 97% 98% 99% 99%  Weight:      Height:  5' (1.524 m)     Constitutional: Trach, on vent, appears tired, in mild distress, unable to talk because of the trach, on vent Eyes: PERRL, lids and conjunctivae normal ENMT: Mucous membranes are moist. Posterior pharynx clear of any exudate or lesions.Normal dentition.  Neck: Trach in place Respiratory: clear to auscultation bilaterally, no wheezing, no crackles. Normal respiratory effort. No accessory muscle use.  Cardiovascular: Regular rate and rhythm, no murmurs / rubs / gallops. No extremity edema. 2+ pedal pulses. No carotid bruits.  Abdomen: no tenderness, no masses palpated. No hepatosplenomegaly. Bowel sounds positive.  Musculoskeletal: no clubbing / cyanosis. No joint deformity upper and lower extremities. Good ROM, no contractures. Normal muscle tone.  Skin: no rashes, lesions, ulcers. No induration Neurologic: CN 2-12 grossly intact. Sensation intact, DTR normal. Strength 4/5 x all 4 extremities.  Data Reviewed:  There are no new results to review at this time.  Family Communication: None at the bedside  Disposition: Status is: Inpatient Remains inpatient appropriate because: Trach, vent dependent  Planned Discharge Destination: LTAC    Time  spent: 44 minutes  Author: Deliliah Room, MD 07/09/2024 10:33 AM  For on call review [email or web address].

## 2024-07-09 NOTE — Plan of Care (Signed)
 Received page from RN that lactic acid is elevated 2.5.  This patient has been admitted for septic shock and unknown source of infection.  Vanco is discontinued and currently on IV cefepime .  Giving 1 L of LR bolus and starting maintenance fluid LR 125 cc/h.  Will recheck lactic acid level in 2 hours after giving the LR bolus.   Jamus Loving, MD [facility] 07/09/2024, 8:04 PM

## 2024-07-09 NOTE — Progress Notes (Incomplete)
 Nutrition Follow-up  DOCUMENTATION CODES:   Underweight, Severe malnutrition in context of chronic illness  INTERVENTION:   Tube Feeding    NUTRITION DIAGNOSIS:   Severe Malnutrition related to chronic illness as evidenced by severe fat depletion, severe muscle depletion.  Being addresse  GOAL:   Patient will meet greater than or equal to 90% of their needs  ***  MONITOR:   TF tolerance, Labs, Weight trends, Skin, I & O's  REASON FOR ASSESSMENT:   Consult, Ventilator Enteral/tube feeding initiation and management  ASSESSMENT:   78 yo female admitted with septic shock secondary to pneumonia, acute on chronic respiratory failure on vent via trach. PMH includes ALS s/p trach/vent and PEG with dysphagia, HTN, HLD, CAD, FTT, depression, a.fib with RVR, chronic wound  Tolerated trickle TF initiation on 7/14, started  ***   NUTRITION - FOCUSED PHYSICAL EXAM:  {RD Focused Exam List:21252}  Diet Order:   Diet Order             Diet NPO time specified  Diet effective now                   EDUCATION NEEDS:   Not appropriate for education at this time  Skin:  Skin Assessment: Skin Integrity Issues: Skin Integrity Issues:: Stage III Stage III: sacrum, chronic, improving per WOC RN assessment (last seen in June 2025)  Last BM:  7/14  Height:   Ht Readings from Last 1 Encounters:  07/09/24 5' (1.524 m)    Weight:   Wt Readings from Last 1 Encounters:  07/09/24 42.7 kg    BMI:  Body mass index is 18.38 kg/m.  Estimated Nutritional Needs:   Kcal:  1450-1650 kcals  Protein:  75-85 g  Fluid:  >/= 1.5 L   Betsey Finger MS, RDN, LDN, CNSC Registered Dietitian 3 Clinical Nutrition RD Inpatient Contact Info in Amion

## 2024-07-09 NOTE — Progress Notes (Signed)
 [facility] ADULT ICU REPLACEMENT PROTOCOL   The patient does apply for the [facility] Adult ICU Electrolyte Replacment Protocol based on the criteria listed below:   1.Exclusion criteria: TCTS, ECMO, Dialysis, and Myasthenia Gravis patients 2. Is GFR >/= 30 ml/min? Yes.    Patient's GFR today is >60 3. Is SCr </= 2? Yes.   Patient's SCr is 0.31 mg/dL 4. Did SCr increase >/= 0.5 in 24 hours? No. 5.Pt's weight >40kg  Yes.   6. Abnormal electrolyte(s): Phosphorus, Potassium, Magnesium   7. Electrolytes replaced per protocol 8.  Call MD STAT for K+ </= 2.5, Phos </= 1, or Mag </= 1 Physician:  Dr. Haze Medico A Luisangel Wainright 07/09/2024 5:24 AM

## 2024-07-09 NOTE — Plan of Care (Signed)

## 2024-07-09 NOTE — Progress Notes (Signed)
 07/09/2024  Seen in f/u for resp failure.  S: Called for increased WOB.  C/o back pain and increased SOB.  O:    07/09/2024    5:42 PM 07/09/2024    5:12 PM 07/09/2024    4:00 PM  Vitals with BMI  Systolic 175 163 841  Diastolic 89 86 85  Pulse  100 893  Uncomfortable appearing Sats good Tachypneic, anxious Lungs sound okay Heart tachy Trach looks fine   A: Chronic hypoxemic respiratory failure now with acute SOB; primary complaint is lower back pain; exam benign except tachycardia; r/o panic attack, r/o plugs; less likely ACS equivalent  P: Start with dose of pain meds Check EKG, CXR, trop, and lactate Precedex  PRN Nebs as ordered Will f/u  Rolan Sharps MD Pulmonary Critical Care Medicine [employer] if during day (7a-7p) [phone] if after hours (7p-7a)

## 2024-07-10 DIAGNOSIS — J9621 Acute and chronic respiratory failure with hypoxia: Secondary | ICD-10-CM | POA: Diagnosis not present

## 2024-07-10 LAB — CBC
HCT: 31.3 % — ABNORMAL LOW (ref 36.0–46.0)
Hemoglobin: 10 g/dL — ABNORMAL LOW (ref 12.0–15.0)
MCH: 27.7 pg (ref 26.0–34.0)
MCHC: 31.9 g/dL (ref 30.0–36.0)
MCV: 86.7 fL (ref 80.0–100.0)
Platelets: 315 K/uL (ref 150–400)
RBC: 3.61 MIL/uL — ABNORMAL LOW (ref 3.87–5.11)
RDW: 15.5 % (ref 11.5–15.5)
WBC: 7.6 K/uL (ref 4.0–10.5)
nRBC: 0 % (ref 0.0–0.2)

## 2024-07-10 LAB — RENAL FUNCTION PANEL
Albumin: 2.6 g/dL — ABNORMAL LOW (ref 3.5–5.0)
Anion gap: 8 (ref 5–15)
BUN: 21 mg/dL (ref 8–23)
CO2: 30 mmol/L (ref 22–32)
Calcium: 9.1 mg/dL (ref 8.9–10.3)
Chloride: 102 mmol/L (ref 98–111)
Creatinine, Ser: <0.3 mg/dL — ABNORMAL LOW (ref 0.44–1.00)
Glucose, Bld: 129 mg/dL — ABNORMAL HIGH (ref 70–99)
Phosphorus: 2.2 mg/dL — ABNORMAL LOW (ref 2.5–4.6)
Potassium: 4.1 mmol/L (ref 3.5–5.1)
Sodium: 140 mmol/L (ref 135–145)

## 2024-07-10 LAB — GLUCOSE, CAPILLARY
Glucose-Capillary: 102 mg/dL — ABNORMAL HIGH (ref 70–99)
Glucose-Capillary: 108 mg/dL — ABNORMAL HIGH (ref 70–99)
Glucose-Capillary: 116 mg/dL — ABNORMAL HIGH (ref 70–99)
Glucose-Capillary: 128 mg/dL — ABNORMAL HIGH (ref 70–99)
Glucose-Capillary: 131 mg/dL — ABNORMAL HIGH (ref 70–99)
Glucose-Capillary: 95 mg/dL (ref 70–99)

## 2024-07-10 LAB — MAGNESIUM: Magnesium: 1.7 mg/dL (ref 1.7–2.4)

## 2024-07-10 LAB — LACTIC ACID, PLASMA
Lactic Acid, Venous: 1.5 mmol/L (ref 0.5–1.9)
Lactic Acid, Venous: 1.6 mmol/L (ref 0.5–1.9)

## 2024-07-10 MED ORDER — MAGNESIUM SULFATE 2 GM/50ML IV SOLN
2.0000 g | Freq: Once | INTRAVENOUS | Status: AC
  Administered 2024-07-10: 2 g via INTRAVENOUS
  Filled 2024-07-10: qty 50

## 2024-07-10 MED ORDER — SENNOSIDES-DOCUSATE SODIUM 8.6-50 MG PO TABS: 1.0000 | ORAL_TABLET | Freq: Two times a day (BID) | ORAL | Status: DC | PRN

## 2024-07-10 MED ORDER — SENNA 8.6 MG PO TABS: 1.0000 | ORAL_TABLET | Freq: Every day | ORAL | Status: DC

## 2024-07-10 MED ORDER — JUVEN PO PACK
1.0000 | PACK | Freq: Two times a day (BID) | ORAL | Status: DC
  Administered 2024-07-10 – 2024-07-14 (×8): 1
  Filled 2024-07-10 (×8): qty 1

## 2024-07-10 MED ORDER — SODIUM CHLORIDE 0.9% FLUSH: 10.0000 mL | INTRAVENOUS | Status: DC | PRN

## 2024-07-10 MED ORDER — K PHOS MONO-SOD PHOS DI & MONO 155-852-130 MG PO TABS
500.0000 mg | ORAL_TABLET | ORAL | Status: AC
  Administered 2024-07-10 (×3): 500 mg
  Filled 2024-07-10 (×3): qty 2

## 2024-07-10 MED ORDER — LORAZEPAM 2 MG/ML IJ SOLN
1.0000 mg | INTRAMUSCULAR | Status: DC | PRN
  Administered 2024-07-10 – 2024-07-12 (×6): 1 mg via INTRAVENOUS
  Filled 2024-07-10 (×7): qty 1

## 2024-07-10 MED ORDER — FREE WATER
150.0000 mL | Freq: Four times a day (QID) | Status: DC
  Administered 2024-07-10 – 2024-07-14 (×16): 150 mL

## 2024-07-10 MED ORDER — SERTRALINE HCL 50 MG PO TABS
50.0000 mg | ORAL_TABLET | Freq: Every day | ORAL | Status: DC
  Administered 2024-07-10 – 2024-07-14 (×4): 50 mg
  Filled 2024-07-10 (×5): qty 1

## 2024-07-10 NOTE — Plan of Care (Signed)
 Repeat lactic acid still elevated 2.4.  Giving second liter of LR bolus and continue maintenance fluid LR 125 cc/h.  Concern for persistent lactic acidosis in the setting of intermittent use of Precedex  drip for agitation.  Otherwise patient is hemodynamically stable.

## 2024-07-10 NOTE — Progress Notes (Signed)
 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  She has a h/o A fib and was in A fib with RVR on admission started on Cardizem  drip.  Cxr showing pneumonia.  Normal lactic acid and increased WBC 17, Cr 0.35.  She has a h/o stenotrophomonas maltophilia lung infection. She is on 40% FiO2 and PEEP of 5 on AC mode on mechanical ventilation.  Pending placement to LTAC.     Assessment and Plan:  Septic shock,POA: Resolved now Etiology appears to be possible bilateral bacterial pneumonia Off of pressors and stress dose steroids Midodrine  was started but dced on 7/16 because of elevated BP. No growth to date on blood cultures Dced vanc. Continue with IV Cefepime  Unremarkable respiratory panel and UA Lactic acidosis has resolved.  Patient received 2 L of LR boluses overnight on 07/10/2024  Anxiety: Started on Precedex  overnight on 07/10/2024.  Will turn it off today.  Continue with as needed anxiolytics.  Atrial fibrillation with RVR,POA: S/p cardizem  drip. She is in sinus rhythm now. TSH-Normal  Acute on chronic hypoxic respiratory failure in the setting of chronic trach and vent dependency,POA: She is back on her baseline FiO2 of 40%, PEEP 5 on AC mode Continue with inhalational bronchodilator therapy Goal sat 92% and above Monitor Peak and Plateau pressures  ALS,POA: Continue with supportive care.  Nutrition: PEG tube in place and currently on tube feeds with Osmolite at 50 cc/h.SABRA No significant residuals.  Moderate protein calorie malnutrition: As evidenced by low albumin  and low BMI. On tube feeds  DVT prophylaxis: Lovenox   GI prophylaxis: Pepcid   Disposition: She is unwilling to go back to [facility]. [facility]  will evaluate for admission. Case management on board.        Subjective: She remains on trach and vent.  She is currently on FiO2 of 40%, PEEP of 5, respiratory rate of 26 and tidal volume of 360 on AC mode on mechanical ventilation.  Patient was tachycardic overnight and had lactic acidosis for which she received total of 2 L of LR bolus and was started on maintenance LR at 125 cc/h.  Repeat lactate is within normal limits.  Repeat lactate patient was also started on Precedex  with improvement in the heart rate.  Critical care team was also consulted.  Physical Exam: Vitals:   07/10/24 0600 07/10/24 0700 07/10/24 0740 07/10/24 0800  BP: 101/69 137/81 137/81 111/74  Pulse:   67   Resp: (!) 26 18 (!) 26 19  Temp:      TempSrc:      SpO2:   99%   Weight:      Height:       Constitutional: Trach, on vent,  unable to talk because of the trach, on vent Eyes: PERRL, lids and conjunctivae normal ENMT: Mucous membranes are moist. Posterior pharynx clear of any exudate or lesions.Normal dentition.  Neck: Trach in place Respiratory: clear to auscultation bilaterally, no wheezing, no crackles. Normal respiratory effort. No accessory muscle use.  Cardiovascular: Regular rate and rhythm, no murmurs / rubs / gallops. No extremity edema. 2+ pedal pulses. No carotid bruits.  Abdomen: no tenderness, no masses palpated. No hepatosplenomegaly. Bowel sounds positive.  Musculoskeletal: no clubbing /  cyanosis. No joint deformity upper and lower extremities. Good ROM, no contractures. Normal muscle tone.  Skin: no rashes, lesions, ulcers. No induration Neurologic: CN 2-12 grossly intact. Sensation intact, DTR normal. Strength 4/5 x all 4 extremities.  Data Reviewed:  There are no new results to review at this time.  Family Communication: None at the bedside  Disposition: Status is: Inpatient Remains inpatient appropriate because: Trach, vent dependent  Planned Discharge Destination: LTAC    Time  spent: 44 minutes  Author: Deliliah Room, MD 07/10/2024 9:03 AM  For on call review [email or web address].

## 2024-07-10 NOTE — Procedures (Signed)
 Tracheostomy Change Note  Patient Details:   Name: Dominique Brewer DOB: [birth year] MRN: [id number]    Airway Documentation:     Evaluation  O2 sats: stable throughout Complications: No apparent complications Patient did tolerate procedure well. Bilateral Breath Sounds: Diminished   [name] was changed to a #6 cuffed Shiley with RT x 2 without any complications. Positive color change noted on CO2 detector. [name] was secured with trach ties.   [name] CROME 07/10/2024, 12:28 PM

## 2024-07-10 NOTE — Progress Notes (Signed)
 Nutrition Follow-up  DOCUMENTATION CODES:   Underweight, Severe malnutrition in context of chronic illness  INTERVENTION:   Tube Feeding via PEG: Osmolite 1.2 at 50 ml/hr with Pro-Source TF20 60 ml daily TF at goal provides 1520 kcals, 972 mL of free water   Add Free Water  Flush of 150 mL q 6 hours providing additional 600 mL free water   Add Juven BID, each packet provides 80 calories, 8 grams of carbohydrate, 2.5  grams of protein (collagen), 7 grams of L-arginine and 7 grams of L-glutamine; supplement contains CaHMB, Vitamins C, E, B12 and Zinc to promote wound healing   NUTRITION DIAGNOSIS:   Severe Malnutrition related to chronic illness as evidenced by severe fat depletion, severe muscle depletion.  Being addressed via TF   GOAL:   Patient will meet greater than or equal to 90% of their needs  Progressing  MONITOR:   TF tolerance, Labs, Weight trends, Skin, I & O's  REASON FOR ASSESSMENT:   Consult, Ventilator Enteral/tube feeding initiation and management  ASSESSMENT:   78 yo female admitted with septic shock secondary to pneumonia, acute on chronic respiratory failure on vent via trach. PMH includes ALS s/p trach/vent and PEG with dysphagia, HTN, HLD, CAD, FTT, depression, a.fib with RVR, chronic wound  7/13 Admitted 7/14 Trickle TF initiated, hypothermic, hypotensive 7/15 Titration of TF initiated 7/16 Abd Distention, Abd xray with gas filled loops of bowel, no obstruction 7/17 Trach change  Back on baseline vent settings via trach Osmolite 1.5 at 50 ml/hr via PEG  Weight up to 43.9 kg, no edema on exam  Noted pt continues to receive LR boluses at times  Large BM on 7/15, small firm BMs yesterday  Labs: Phosphorus 2.2 (L) Magnesium  1.7 (wdl) Sodium 140 (wdl)] Potassium 4.1 (wdl) Creatinine <0.30 (L) Lactic Acid wdl CBGs 108-169  Meds: SS novolog  Thiamine  K Phos  Neutral Senna   Diet Order:   Diet Order             Diet NPO time  specified  Diet effective now                   EDUCATION NEEDS:   Not appropriate for education at this time  Skin:  Skin Assessment: Skin Integrity Issues: Skin Integrity Issues:: Stage III Stage III: sacrum, chronic, improving per WOC RN assessment (last seen in June 2025)  Last BM:  7/14  Height:   Ht Readings from Last 1 Encounters:  07/09/24 5' (1.524 m)    Weight:   Wt Readings from Last 1 Encounters:  07/10/24 43.9 kg    BMI:  Body mass index is 18.9 kg/m.  Estimated Nutritional Needs:   Kcal:  1450-1650 kcals  Protein:  75-85 g  Fluid:  >/= 1.5 L    Betsey Finger MS, RDN, LDN, CNSC Registered Dietitian 3 Clinical Nutrition RD Inpatient Contact Info in Amion

## 2024-07-10 NOTE — Progress Notes (Signed)
 Interval PCCM Progress Note   Called yesterday by hospitalist team for increased work of breathing. Given pain control. We checked CXR which was ultimately unchanged from previous. Lactic checked and cleared. Troponin negative. Precedex  was started. This was probably anxiety/panic.   Off precedex  this morning. Starting her on SSRI, increasing her Ativan  PRN dosing if she needs this. Will sign back off at this time.   Dominique FORBES Furth, PA-C [facility] 07/10/24 9:01 AM  Please see [email or web address] for pager details.  From 7A-7P if no response, please call [phone] After hours, please call ELink [phone]

## 2024-07-11 ENCOUNTER — Inpatient Hospital Stay (HOSPITAL_COMMUNITY)

## 2024-07-11 DIAGNOSIS — J9621 Acute and chronic respiratory failure with hypoxia: Secondary | ICD-10-CM | POA: Diagnosis not present

## 2024-07-11 LAB — MAGNESIUM: Magnesium: 1.9 mg/dL (ref 1.7–2.4)

## 2024-07-11 LAB — CBC
HCT: 35.5 % — ABNORMAL LOW (ref 36.0–46.0)
Hemoglobin: 11.3 g/dL — ABNORMAL LOW (ref 12.0–15.0)
MCH: 27.5 pg (ref 26.0–34.0)
MCHC: 31.8 g/dL (ref 30.0–36.0)
MCV: 86.4 fL (ref 80.0–100.0)
Platelets: 359 K/uL (ref 150–400)
RBC: 4.11 MIL/uL (ref 3.87–5.11)
RDW: 15.9 % — ABNORMAL HIGH (ref 11.5–15.5)
WBC: 9.6 K/uL (ref 4.0–10.5)
nRBC: 0 % (ref 0.0–0.2)

## 2024-07-11 LAB — GLUCOSE, CAPILLARY
Glucose-Capillary: 106 mg/dL — ABNORMAL HIGH (ref 70–99)
Glucose-Capillary: 117 mg/dL — ABNORMAL HIGH (ref 70–99)
Glucose-Capillary: 134 mg/dL — ABNORMAL HIGH (ref 70–99)
Glucose-Capillary: 164 mg/dL — ABNORMAL HIGH (ref 70–99)
Glucose-Capillary: 86 mg/dL (ref 70–99)

## 2024-07-11 LAB — RENAL FUNCTION PANEL
Albumin: 3 g/dL — ABNORMAL LOW (ref 3.5–5.0)
Anion gap: 12 (ref 5–15)
BUN: 24 mg/dL — ABNORMAL HIGH (ref 8–23)
CO2: 32 mmol/L (ref 22–32)
Calcium: 9.5 mg/dL (ref 8.9–10.3)
Chloride: 96 mmol/L — ABNORMAL LOW (ref 98–111)
Creatinine, Ser: 0.36 mg/dL — ABNORMAL LOW (ref 0.44–1.00)
GFR, Estimated: >60 mL/min (ref >60)
Glucose, Bld: 76 mg/dL (ref 70–99)
Phosphorus: 3.4 mg/dL (ref 2.5–4.6)
Potassium: 4.3 mmol/L (ref 3.5–5.1)
Sodium: 140 mmol/L (ref 135–145)

## 2024-07-11 LAB — PHOSPHORUS: Phosphorus: 3.4 mg/dL (ref 2.5–4.6)

## 2024-07-11 MED ORDER — METOPROLOL TARTRATE 5 MG/5ML IV SOLN
2.5000 mg | INTRAVENOUS | Status: AC
  Administered 2024-07-11: 2.5 mg via INTRAVENOUS
  Filled 2024-07-11: qty 5

## 2024-07-11 MED ORDER — LORAZEPAM 2 MG/ML IJ SOLN
1.0000 mg | Freq: Once | INTRAMUSCULAR | Status: AC
  Administered 2024-07-11: 1 mg via INTRAVENOUS

## 2024-07-11 MED ORDER — LABETALOL HCL 5 MG/ML IV SOLN
10.0000 mg | INTRAVENOUS | Status: DC | PRN
  Filled 2024-07-11: qty 4

## 2024-07-11 NOTE — Plan of Care (Signed)
 RN reported that patient is persistently anxious with elevated blood pressure.  After giving hydralazine  patient developed tachycardia with irregular rate at bedside cardiac monitor heart rate between 130-140 range.  O2 sat 98% on trach collar dependent. Patient is very anxious currently on Ativan  as needed, Atarax  as needed and being started on Zoloft .  Patient was on Precedex  drip overnight 7/17 which has been transition to as needed Atarax  and Ativan . -Patient already received Ativan  1 mg at 12:30 am still patient is anxious.  Giving another dose of Ativan  1 mg and Atarax  10 mg oral. After giving the antioxidant medication we will get the EKG for accurate assessment. If patient continues to remain anxious even with multiple doses of Ativan /Atarax  in that case need to restart the Precedex  drip again.  Echo Propp, MD [facility] 07/11/2024, 1:21 AM

## 2024-07-11 NOTE — TOC Progression Note (Addendum)
 Transition of Care [facility]) - Progression Note    Patient Details  Name: Dominique Brewer MRN: [id number] Date of Birth: [birth year]  [facility]) CM/SW Contact  Isaiah Public, LCSWA Phone Number: 07/11/2024, 3:09 PM  Clinical Narrative:     CSW spoke with patients sister Karna. CSW introduced self. Patients sister confirmed plan for patient is for patient to return back to [facility] when patient is medically stable for dc. CSW informed patients sister that CM informed CSW that she may be interested in patient going to another facility. Patients sister informed CSW that she does want patient to return back to [facility] when ready for dc. CSW informed patients sister that [facility] will continue to follow and will help assist with patients dc back over to [facility] when patient medically ready for dc. All questions answered. No further questions reported at this time. CSW LVM with Angie from [facility]. CSW awaiting call back to confirm if facility can accept patient tomorrow if medically stable for dc.  Expected Discharge Plan: Skilled Nursing Facility Barriers to Discharge: Continued Medical Work up  Expected Discharge Plan and Services   Discharge Planning Services: CM Consult Post Acute Care Choice: Long Term Acute Care (LTAC)                                         Social Determinants of Health (SDOH) Interventions SDOH Screenings   Food Insecurity: Patient Unable To Answer (07/07/2024)  Housing: Unknown (07/07/2024)  Transportation Needs: Patient Unable To Answer (07/07/2024)  Utilities: Patient Unable To Answer (07/07/2024)  Financial Resource Strain: High Risk (06/08/2023)   Received from [facility]  Physical Activity: Unknown (12/08/2022)   Received from [facility]  Social Connections: Patient Unable To Answer (07/07/2024)  Stress: No Stress Concern Present (12/08/2022)   Received from [facility]  Tobacco Use: Low Risk  (07/06/2024)    Readmission Risk  Interventions     No data to display

## 2024-07-11 NOTE — Progress Notes (Signed)
 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  She has a h/o A fib and was in A fib with RVR on admission started on Cardizem  drip.  Cxr showing pneumonia.  Normal lactic acid and increased WBC 17, Cr 0.35.  She has a h/o stenotrophomonas maltophilia lung infection. She is on 40% FiO2 and PEEP of 5 on PRVC mode on mechanical ventilation.  Pending placement to LTAC.     Assessment and Plan:  Septic shock,POA: Resolved now Etiology appears to be possible bilateral bacterial pneumonia Off of pressors and stress dose steroids Midodrine  was started but dced on 7/16 because of elevated BP. No growth to date on blood cultures Dced vanc. Continue with IV Cefepime  Unremarkable respiratory panel and UA Lactic acidosis has resolved.  Patient received 2 L of LR boluses overnight on 07/10/2024  Anxiety: Started on Precedex  on 07/10/2024 and dced later. Continue with as needed ativan  and atarax . Started on zoloft . Start Precedex  drip, if needed.  Atrial fibrillation with RVR,POA: S/p cardizem  drip. She is in sinus rhythm now. TSH-Normal  Acute on chronic hypoxic respiratory failure in the setting of chronic trach and vent dependency,POA: She is back on her baseline FiO2 of 40%, PEEP 5 on AC mode Continue with inhalational bronchodilator therapy Goal sat 92% and above Monitor Peak and Plateau pressures  ALS,POA: Continue with supportive care.  Nutrition: PEG tube in place and currently on tube feeds with Osmolite at 50 cc/h.SABRA No significant residuals.  Moderate protein calorie malnutrition: As evidenced by low albumin  and low BMI. On tube feeds  DVT prophylaxis: Lovenox   GI prophylaxis: Pepcid   Disposition: She is  unwilling to go back to [facility]. [facility] will evaluate for admission. Case management on board.        Subjective: She remains on trach and vent.  She is currently on FiO2 of 40%, PEEP of 5, respiratory rate of 26 and tidal volume of 360 on PRVC mode on mechanical ventilation.  She had episodes of anxiety and elevated BP. She is already on prn ativan  and atarax  and was given additional doses of them overnight. She is not on precedex . She is also started on zoloft .  Physical Exam: Vitals:   07/11/24 0700 07/11/24 0716 07/11/24 0800 07/11/24 0900  BP: 138/80 138/80 (!) 96/57 118/63  Pulse: (!) 115 (!) 106 93 90  Resp: 13 (!) 26 (!) 26 (!) 21  Temp: 97.9 F (36.6 C)     TempSrc: Axillary     SpO2: 95% 96% 97% 98%  Weight:      Height:       Constitutional: Trach, on vent,  unable to talk because of the trach, on vent, appears drowsy Eyes: PERRL, lids and conjunctivae normal ENMT: Mucous membranes are moist. Posterior pharynx clear of any exudate or lesions.Normal dentition.  Neck: Trach in place Respiratory: clear to auscultation bilaterally, no wheezing, no crackles. Normal respiratory effort. No accessory muscle use.  Cardiovascular: Regular rate and rhythm, no murmurs / rubs / gallops. No extremity edema. 2+ pedal pulses. No carotid bruits.  Abdomen: no tenderness, no masses palpated. No hepatosplenomegaly. Bowel sounds positive.  Musculoskeletal: no clubbing / cyanosis. No joint deformity upper and lower  extremities. Good ROM, no contractures. Normal muscle tone.  Skin: no rashes, lesions, ulcers. No induration Neurologic: CN 2-12 grossly intact. Sensation intact, DTR normal. Strength 4/5 x all 4 extremities.   Data Reviewed:  There are no new results to review at this time.  Family Communication: None at the bedside  Disposition: Status is: Inpatient Remains inpatient appropriate because: Trach, vent dependent  Planned Discharge Destination: LTAC    Time spent: 44  minutes  Author: Deliliah Room, MD 07/11/2024 10:18 AM  For on call review [email or web address].

## 2024-07-12 DIAGNOSIS — I4891 Unspecified atrial fibrillation: Secondary | ICD-10-CM | POA: Diagnosis not present

## 2024-07-12 LAB — RENAL FUNCTION PANEL
Albumin: 2.6 g/dL — ABNORMAL LOW (ref 3.5–5.0)
Anion gap: 7 (ref 5–15)
BUN: 24 mg/dL — ABNORMAL HIGH (ref 8–23)
CO2: 34 mmol/L — ABNORMAL HIGH (ref 22–32)
Calcium: 9.3 mg/dL (ref 8.9–10.3)
Chloride: 97 mmol/L — ABNORMAL LOW (ref 98–111)
Creatinine, Ser: 0.37 mg/dL — ABNORMAL LOW (ref 0.44–1.00)
GFR, Estimated: >60 mL/min (ref >60)
Glucose, Bld: 124 mg/dL — ABNORMAL HIGH (ref 70–99)
Phosphorus: 3.2 mg/dL (ref 2.5–4.6)
Potassium: 4.5 mmol/L (ref 3.5–5.1)
Sodium: 138 mmol/L (ref 135–145)

## 2024-07-12 LAB — GLUCOSE, CAPILLARY
Glucose-Capillary: 145 mg/dL — ABNORMAL HIGH (ref 70–99)
Glucose-Capillary: 121 mg/dL — ABNORMAL HIGH (ref 70–99)
Glucose-Capillary: 113 mg/dL — ABNORMAL HIGH (ref 70–99)
Glucose-Capillary: 137 mg/dL — ABNORMAL HIGH (ref 70–99)
Glucose-Capillary: 138 mg/dL — ABNORMAL HIGH (ref 70–99)
Glucose-Capillary: 119 mg/dL — ABNORMAL HIGH (ref 70–99)
Glucose-Capillary: 126 mg/dL — ABNORMAL HIGH (ref 70–99)

## 2024-07-12 LAB — CULTURE, BLOOD (ROUTINE X 2)
Culture: NO GROWTH
Culture: NO GROWTH

## 2024-07-12 LAB — PHOSPHORUS: Phosphorus: 3.1 mg/dL (ref 2.5–4.6)

## 2024-07-12 LAB — CBC
HCT: 30.8 % — ABNORMAL LOW (ref 36.0–46.0)
Hemoglobin: 9.9 g/dL — ABNORMAL LOW (ref 12.0–15.0)
MCH: 27.7 pg (ref 26.0–34.0)
MCHC: 32.1 g/dL (ref 30.0–36.0)
MCV: 86.3 fL (ref 80.0–100.0)
Platelets: 354 K/uL (ref 150–400)
RBC: 3.57 MIL/uL — ABNORMAL LOW (ref 3.87–5.11)
RDW: 16 % — ABNORMAL HIGH (ref 11.5–15.5)
WBC: 8.3 K/uL (ref 4.0–10.5)
nRBC: 0 % (ref 0.0–0.2)

## 2024-07-12 LAB — MAGNESIUM: Magnesium: 1.7 mg/dL (ref 1.7–2.4)

## 2024-07-12 MED ORDER — MAGNESIUM SULFATE 2 GM/50ML IV SOLN
2.0000 g | Freq: Once | INTRAVENOUS | Status: AC
  Administered 2024-07-12: 2 g via INTRAVENOUS
  Filled 2024-07-12: qty 50

## 2024-07-12 MED ORDER — DILTIAZEM HCL 30 MG PO TABS
30.0000 mg | ORAL_TABLET | Freq: Three times a day (TID) | ORAL | Status: DC
  Administered 2024-07-12 – 2024-07-13 (×3): 30 mg via ORAL
  Filled 2024-07-12 (×3): qty 1

## 2024-07-12 MED ORDER — MIDODRINE HCL 5 MG PO TABS: 5.0000 mg | ORAL_TABLET | Freq: Three times a day (TID) | ORAL | Status: DC

## 2024-07-12 MED ORDER — MIDODRINE HCL 5 MG PO TABS
5.0000 mg | ORAL_TABLET | Freq: Two times a day (BID) | ORAL | Status: DC
  Administered 2024-07-12 – 2024-07-13 (×2): 5 mg via ORAL
  Filled 2024-07-12 (×3): qty 1

## 2024-07-12 MED ORDER — MIDODRINE HCL 5 MG PO TABS: 5.0000 mg | ORAL_TABLET | Freq: Once | ORAL | Status: DC

## 2024-07-12 NOTE — Progress Notes (Signed)
 [facility] ADULT ICU REPLACEMENT PROTOCOL   The patient does apply for the [facility] Adult ICU Electrolyte Replacment Protocol based on the criteria listed below:   1.Exclusion criteria: TCTS, ECMO, Dialysis, and Myasthenia Gravis patients 2. Is GFR >/= 30 ml/min? Yes.    Patient's GFR today is >60 3. Is SCr </= 2? Yes.   Patient's SCr is 0.37 mg/dL 4. Did SCr increase >/= 0.5 in 24 hours? No. 5.Pt's weight >40kg  Yes.   6. Abnormal electrolyte(s): Mg= 1.7  7. Electrolytes replaced per protocol 8.  Call MD STAT for K+ </= 2.5, Phos </= 1, or Mag </= 1 Physician:  Mallory Breed, eMD  Rosina LOISE Hamilton 07/12/2024 5:29 AM

## 2024-07-12 NOTE — TOC Progression Note (Signed)
 Transition of Care [facility]) - Progression Note    Patient Details  Name: Dominique Brewer MRN: [id number] Date of Birth: [birth year]  [facility] CM/SW Contact  Faye Strohman [place], [place] Phone Number: 07/12/2024, 10:15 AM  Clinical Narrative:     Phone call to [facility], left message for Angie to confirm patient's return.  Kinzi Frediani, LCSW Transition of Care    Expected Discharge Plan: Skilled Nursing Facility Barriers to Discharge: Continued Medical Work up  Expected Discharge Plan and Services   Discharge Planning Services: CM Consult Post Acute Care Choice: Long Term Acute Care (LTAC)                                         Social Determinants of Health (SDOH) Interventions SDOH Screenings   Food Insecurity: Patient Unable To Answer (07/07/2024)  Housing: Unknown (07/07/2024)  Transportation Needs: Patient Unable To Answer (07/07/2024)  Utilities: Patient Unable To Answer (07/07/2024)  Financial Resource Strain: High Risk (06/08/2023)   Received from [facility]  Physical Activity: Unknown (12/08/2022)   Received from [facility]  Social Connections: Patient Unable To Answer (07/07/2024)  Stress: No Stress Concern Present (12/08/2022)   Received from [facility]  Tobacco Use: Low Risk  (07/06/2024)    Readmission Risk Interventions     No data to display

## 2024-07-12 NOTE — Progress Notes (Signed)
 PROGRESS NOTE    Dominique Brewer  FMW:[id number]  DOB: 01-19-1946  DOA: 07/06/2024 PCP: Fleeta Valeria Mayo, MD Outpatient Specialists:   Hospital course:  78 y/o female with ALS, A-fib, HTN, HLD, CAD, FTT and anxiety/depression was admitted for SOB from NH/LTAC, She is s/p Trach and PEG and is on chronic vent.  Workup revealed new pneumonia as well as A-fib with RVR.  She has been treated with vancomycin  and cefepime  as well as Cardizem  drip.  Course has been complicated by episodes of anxiety with resultant tachycardia and increased work of breathing.  She has intermittently required Precedex  drip to treat her anxiety.  Of note, she has a h/o stenotrophomonas maltophilia lung infection.    Subjective:  Patient unable to give a history.  RN notes episodes of SVT up to 180-190 overnight, self-limited, asymptomatic, resolve spontaneously.   Objective: Vitals:   07/12/24 0841 07/12/24 0900 07/12/24 1000 07/12/24 1100  BP:  105/64 (!) 105/55 (!) 97/52  Pulse: 68 87 76 77  Resp: (!) 26 (!) 26 (!) 26 (!) 26  Temp:    98.2 F (36.8 C)  TempSrc:    Axillary  SpO2: 100% 98% 100% 100%  Weight:      Height:        Intake/Output Summary (Last 24 hours) at 07/12/2024 1327 Last data filed at 07/12/2024 1131 Gross per 24 hour  Intake 1700.06 ml  Output 1300 ml  Net 400.06 ml   Filed Weights   07/10/24 0500 07/11/24 0500 07/12/24 0500  Weight: 43.9 kg 44.9 kg 42.7 kg     Exam:  General: Patient lying in bed, sleeping comfortably in NAD Eyes: sclera anicteric, conjuctiva mild injection bilaterally CVS: S1-S2, regular  Respiratory: On ventilator with mechanical heart sounds GI: NABS, soft, NT  LE: Warm and well-perfused   Data Reviewed:  Basic Metabolic Panel: Recent Labs  Lab 07/08/24 0331 07/09/24 0337 07/09/24 0338 07/10/24 0150 07/10/24 0151 07/11/24 0700 07/11/24 0702 07/12/24 0415 07/12/24 0416  NA 138 139  --   --  140  --  140  --  138  K 3.7 3.1*  --   --   4.1  --  4.3  --  4.5  CL 102 100  --   --  102  --  96*  --  97*  CO2 26 28  --   --  30  --  32  --  34*  GLUCOSE 118* 142*  --   --  129*  --  76  --  124*  BUN 20 22  --   --  21  --  24*  --  24*  CREATININE 0.30* 0.31*  --   --  <0.30*  --  0.36*  --  0.37*  CALCIUM 9.7 9.4  --   --  9.1  --  9.5  --  9.3  MG 1.8  --  1.8 1.7  --  1.9  --  1.7  --   PHOS 3.0 2.0* 2.0*  --  2.2* 3.4 3.4 3.1 3.2    CBC: Recent Labs  Lab 07/06/24 2154 07/06/24 2308 07/08/24 0331 07/09/24 0338 07/10/24 0150 07/11/24 0700 07/12/24 0415  WBC 17.1*   < > 14.8* 13.3* 7.6 9.6 8.3  NEUTROABS 14.3*  --   --   --   --   --   --   HGB 11.4*   < > 8.7* 11.1* 10.0* 11.3* 9.9*  HCT 35.9*   < >  27.2* 34.9* 31.3* 35.5* 30.8*  MCV 88.0   < > 87.5 87.5 86.7 86.4 86.3  PLT 341   < > 278 342 315 359 354   < > = values in this interval not displayed.     Scheduled Meds:  arformoterol   15 mcg Nebulization BID   Chlorhexidine  Gluconate Cloth  6 each Topical Daily   enoxaparin  (LOVENOX ) injection  30 mg Subcutaneous Q24H   famotidine   20 mg Per Tube Daily   feeding supplement (PROSource TF20)  60 mL Per Tube Daily   free water   150 mL Per Tube Q6H   insulin  aspart  0-9 Units Subcutaneous Q4H   latanoprost   1 drop Both Eyes QHS   nutrition supplement (JUVEN)  1 packet Per Tube BID BM   mouth rinse  15 mL Mouth Rinse Q2H   revefenacin   175 mcg Nebulization Daily   senna  1 tablet Per Tube Daily   sertraline   50 mg Per Tube Daily   thiamine   100 mg Per Tube Daily   Continuous Infusions:  feeding supplement (OSMOLITE 1.2 CAL) 1,000 mL (07/12/24 9187)     Assessment & Plan:   Intermittent asymptomatic narrow complex tachycardia PAF Patient was unable to tolerate Cardizem  due to hypotension She had been placed on midodrine  however this was discontinued on 7/16 due to hypertension.Discussed with cardiology, they note it would be preferable for her to be on some sort of AV nodal blocker given known  A-fib Will place patient on low-dose midodrine  5 mg q8h and initiate low-dose short acting Cardizem  3 mg p.o. Q8 to see if she is able to tolerate that. Will need to titrate midodrine /Cardizem  as warranted  Not on anticoagulation for stroke prevention  Anxiety with consequent WOB/tachycardia Patient is doing much better on present dose of sertraline  and as needed Ativan   Acute on chronic hypoxic respiratory failure VAP Patient was managed in the ICU on vancomycin  and cefepime , completed course Also completed a course of steroids She is clinically improving with normalization of her usual vent settings  Protein calorie malnutrition, moderate Continue management with Osmolite per nutrition  ALS Continue supportive care with tracheostomy and PEG tube feeds    DVT prophylaxis: Lovenox  Code Status: Full Family Communication: None today     Studies: DG Chest Port 1 View Result Date: 07/11/2024 EXAM: 1 VIEW XRAY OF THE CHEST 07/11/2024 01:41:34 AM COMPARISON: 07/09/2024 CLINICAL HISTORY: [id number] SOB (shortness of breath) [id number]. SOB (shortness of breath) [id number] FINDINGS: LUNGS AND PLEURA: Diffuse bilateral interstitial and airspace opacities with a mid and lower lung predominance are similar to prior. Probable small bilateral pleural effusions. No pneumothorax. HEART AND MEDIASTINUM: Stable cardiomediastinal silhouette. Aortic atherosclerotic calcification. BONES AND SOFT TISSUES: No acute osseous abnormality. Tracheostomy. IMPRESSION: 1. Bilateral interstitial and airspace opacities with a mid and lower lung predominance, similar to prior. 2. Small bilateral pleural effusions. Electronically signed by: Norman Gatlin MD 07/11/2024 01:52 AM EDT RP Workstation: [id number]    Principal Problem:   Acute and chronic respiratory failure (acute-on-chronic) (HCC) Active Problems:   Atrial fibrillation with RVR (HCC)     Amey Hossain Vangie Pike, [facility]  If 7PM-7AM, please  contact night-coverage [email or web address]   LOS: 5 days

## 2024-07-13 DIAGNOSIS — J9621 Acute and chronic respiratory failure with hypoxia: Secondary | ICD-10-CM | POA: Diagnosis not present

## 2024-07-13 LAB — RENAL FUNCTION PANEL
Albumin: 2.7 g/dL — ABNORMAL LOW (ref 3.5–5.0)
Anion gap: 9 (ref 5–15)
BUN: 29 mg/dL — ABNORMAL HIGH (ref 8–23)
CO2: 34 mmol/L — ABNORMAL HIGH (ref 22–32)
Calcium: 9.3 mg/dL (ref 8.9–10.3)
Chloride: 95 mmol/L — ABNORMAL LOW (ref 98–111)
Creatinine, Ser: 0.32 mg/dL — ABNORMAL LOW (ref 0.44–1.00)
GFR, Estimated: >60 mL/min (ref >60)
Glucose, Bld: 128 mg/dL — ABNORMAL HIGH (ref 70–99)
Phosphorus: 3.8 mg/dL (ref 2.5–4.6)
Potassium: 4.7 mmol/L (ref 3.5–5.1)
Sodium: 138 mmol/L (ref 135–145)

## 2024-07-13 LAB — GLUCOSE, CAPILLARY
Glucose-Capillary: 110 mg/dL — ABNORMAL HIGH (ref 70–99)
Glucose-Capillary: 112 mg/dL — ABNORMAL HIGH (ref 70–99)
Glucose-Capillary: 114 mg/dL — ABNORMAL HIGH (ref 70–99)
Glucose-Capillary: 126 mg/dL — ABNORMAL HIGH (ref 70–99)
Glucose-Capillary: 126 mg/dL — ABNORMAL HIGH (ref 70–99)
Glucose-Capillary: 97 mg/dL (ref 70–99)

## 2024-07-13 MED ORDER — DILTIAZEM HCL 30 MG PO TABS
30.0000 mg | ORAL_TABLET | Freq: Three times a day (TID) | ORAL | Status: DC
  Administered 2024-07-13 – 2024-07-14 (×3): 30 mg
  Filled 2024-07-13 (×3): qty 1

## 2024-07-13 MED ORDER — MIDODRINE HCL 5 MG PO TABS
5.0000 mg | ORAL_TABLET | Freq: Two times a day (BID) | ORAL | Status: DC
  Administered 2024-07-14: 5 mg
  Filled 2024-07-13: qty 1

## 2024-07-13 MED ORDER — HYDROXYZINE HCL 10 MG/5ML PO SYRP
10.0000 mg | ORAL_SOLUTION | Freq: Three times a day (TID) | ORAL | Status: DC | PRN
  Administered 2024-07-14: 10 mg
  Filled 2024-07-13: qty 5

## 2024-07-13 NOTE — Progress Notes (Signed)
 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  She has a h/o A fib and was in A fib with RVR on admission started on Cardizem  drip.  Cxr showing pneumonia.  Normal lactic acid and increased WBC 17, Cr 0.35.  She has a h/o stenotrophomonas maltophilia lung infection. She is on 40% FiO2 and PEEP of 5 on PRVC mode on mechanical ventilation.  Pending placement to LTAC.     Assessment and Plan:  Septic shock,POA: Resolved now Etiology appears to be possible bilateral bacterial pneumonia Off of pressors and stress dose steroids Midodrine  was started but dced on 7/16 because of elevated BP. No growth to date on blood cultures Dced vanc. Finished course of IV Cefepime  Unremarkable respiratory panel and UA Lactic acidosis has resolved.    Anxiety: Started on Precedex  on 07/10/2024 and dced later. Continue with as needed ativan  and atarax . Started on zoloft . Start Precedex  drip, if needed.  Atrial fibrillation with RVR,POA: S/p cardizem  drip. She is in sinus rhythm now. TSH-Normal  Intermittent narrow complex tachycardia: Continue with cardizem . She is in sinus rhythm.  Acute on chronic hypoxic respiratory failure in the setting of chronic trach and vent dependency,POA: She is back on her baseline FiO2 of 40%, PEEP 5 on AC mode Continue with inhalational bronchodilator therapy Goal sat 92% and above Monitor Peak and Plateau pressures  ALS,POA: Continue with supportive care.  Nutrition: PEG tube in place and currently on tube feeds with Osmolite at 50 cc/h.SABRA No significant residuals.  Moderate protein calorie malnutrition: As evidenced by low albumin  and low BMI. On tube feeds  DVT prophylaxis: Lovenox   GI  prophylaxis: Pepcid   Disposition: Pending placement to [facility]        Subjective: She remains on trach and vent.  She is currently on FiO2 of 40%, PEEP of 5, respiratory rate of 26 and tidal volume of 360 on PRVC mode on mechanical ventilation.  Tube feeds with osmolite going on.   Physical Exam: Vitals:   07/13/24 0552 07/13/24 0600 07/13/24 0700 07/13/24 0811  BP: (!) 116/58 (!) 138/56 (!) 105/51   Pulse:  89 84 83  Resp:  (!) 26 (!) 5 (!) 26  Temp:   98.2 F (36.8 C)   TempSrc:   Axillary   SpO2:  94% 98% 100%  Weight:      Height:       Constitutional: Trach, on vent,  unable to talk because of the trach, on vent, appears drowsy Eyes: PERRL, lids and conjunctivae normal ENMT: Mucous membranes are moist. Posterior pharynx clear of any exudate or lesions.Normal dentition.  Neck: Trach in place Respiratory: clear to auscultation bilaterally, no wheezing, no crackles. Normal respiratory effort. No accessory muscle use.  Cardiovascular: Regular rate and rhythm, no murmurs / rubs / gallops. No extremity edema. 2+ pedal pulses. No carotid bruits.  Abdomen: no tenderness, no masses palpated. No hepatosplenomegaly. Bowel sounds positive.  Musculoskeletal: no clubbing / cyanosis. No joint deformity upper and lower extremities. Good ROM, no contractures. Normal muscle tone.  Skin: no rashes, lesions, ulcers. No induration Neurologic: CN 2-12 grossly intact. Sensation intact, DTR normal. Strength 4/5 x all 4 extremities.   Data Reviewed:  There are no new results to review at this time.  Family Communication: None at the bedside  Disposition: Status is: Inpatient Remains inpatient appropriate because: Trach, vent dependent  Planned Discharge Destination: LTAC    Time spent: 43 minutes  Author: Deliliah Room, MD 07/13/2024 8:53 AM  For on call review [email or web address].

## 2024-07-14 DIAGNOSIS — J9621 Acute and chronic respiratory failure with hypoxia: Secondary | ICD-10-CM | POA: Diagnosis not present

## 2024-07-14 LAB — RENAL FUNCTION PANEL
Albumin: 3.2 g/dL — ABNORMAL LOW (ref 3.5–5.0)
Anion gap: 10 (ref 5–15)
BUN: 23 mg/dL (ref 8–23)
CO2: 33 mmol/L — ABNORMAL HIGH (ref 22–32)
Calcium: 9.9 mg/dL (ref 8.9–10.3)
Chloride: 96 mmol/L — ABNORMAL LOW (ref 98–111)
Creatinine, Ser: 0.33 mg/dL — ABNORMAL LOW (ref 0.44–1.00)
GFR, Estimated: >60 mL/min (ref >60)
Glucose, Bld: 122 mg/dL — ABNORMAL HIGH (ref 70–99)
Phosphorus: 3.8 mg/dL (ref 2.5–4.6)
Potassium: 4.4 mmol/L (ref 3.5–5.1)
Sodium: 139 mmol/L (ref 135–145)

## 2024-07-14 LAB — GLUCOSE, CAPILLARY
Glucose-Capillary: 119 mg/dL — ABNORMAL HIGH (ref 70–99)
Glucose-Capillary: 103 mg/dL — ABNORMAL HIGH (ref 70–99)
Glucose-Capillary: 117 mg/dL — ABNORMAL HIGH (ref 70–99)

## 2024-07-14 MED ORDER — QUETIAPINE FUMARATE 25 MG PO TABS: 25.0000 mg | ORAL_TABLET | Freq: Every day | ORAL | Status: AC

## 2024-07-14 MED ORDER — PROSOURCE TF20 ENFIT COMPATIBL EN LIQD: 60.0000 mL | Freq: Every day | ENTERAL | Status: AC

## 2024-07-14 MED ORDER — IPRATROPIUM-ALBUTEROL 0.5-2.5 (3) MG/3ML IN SOLN: 3.0000 mL | RESPIRATORY_TRACT | Status: AC | PRN

## 2024-07-14 MED ORDER — SERTRALINE HCL 50 MG PO TABS: 50.0000 mg | ORAL_TABLET | Freq: Every day | ORAL | Status: AC

## 2024-07-14 MED ORDER — FREE WATER: 150.0000 mL | Freq: Four times a day (QID) | Status: AC

## 2024-07-14 MED ORDER — SENNA 8.6 MG PO TABS: 1.0000 | ORAL_TABLET | Freq: Every day | ORAL | Status: AC

## 2024-07-14 MED ORDER — ARFORMOTEROL TARTRATE 15 MCG/2ML IN NEBU: 15.0000 ug | INHALATION_SOLUTION | Freq: Two times a day (BID) | RESPIRATORY_TRACT | Status: AC

## 2024-07-14 MED ORDER — HYDROXYZINE HCL 10 MG/5ML PO SYRP: 10.0000 mg | ORAL_SOLUTION | Freq: Three times a day (TID) | ORAL | Status: AC | PRN

## 2024-07-14 MED ORDER — DILTIAZEM HCL 30 MG PO TABS: 30.0000 mg | ORAL_TABLET | Freq: Three times a day (TID) | ORAL | Status: AC

## 2024-07-14 MED ORDER — CHLORHEXIDINE GLUCONATE CLOTH 2 % EX PADS: 6.0000 | MEDICATED_PAD | Freq: Every day | CUTANEOUS | Status: AC

## 2024-07-14 MED ORDER — GERHARDT'S BUTT CREAM: 1.0000 | TOPICAL_CREAM | CUTANEOUS | Status: AC | PRN

## 2024-07-14 MED ORDER — QUETIAPINE FUMARATE 25 MG PO TABS: 25.0000 mg | ORAL_TABLET | Freq: Every day | ORAL | Status: DC

## 2024-07-14 MED ORDER — INCRUSE ELLIPTA 62.5 MCG/ACT IN AEPB: 1.0000 | INHALATION_SPRAY | Freq: Every day | RESPIRATORY_TRACT | Status: AC

## 2024-07-14 MED ORDER — ORAL CARE MOUTH RINSE: 15.0000 mL | OROMUCOSAL | Status: AC | PRN

## 2024-07-14 NOTE — NC FL2 (Signed)
 [place]  MEDICAID FL2 LEVEL OF CARE FORM     IDENTIFICATION  Patient Name: Dominique Brewer Birthdate: Aug 13, 1946 Sex: female Admission Date (Current Location): 07/06/2024  [facility] and [place] Number:  Producer, television/film/video and Address:  [facility], [address], [place], [place] [address]      Provider Number: [id number]  Attending Physician Name and Address:  Dino Antu, MD  Relative Name and Phone Number:  ARZELLA AQUAS (Other)  [phone] [facility])    Current Level of Care: Hospital Recommended Level of Care: Skilled Nursing Facility Prior Approval Number:    Date Approved/Denied:   PASRR Number: [id number]  Discharge Plan: SNF    Current Diagnoses: Patient Active Problem List   Diagnosis Date Noted   Atrial fibrillation with RVR (HCC) 07/09/2024   Acute and chronic respiratory failure (acute-on-chronic) (HCC) 07/07/2024   Hypotension due to drugs 05/30/2024   Pressure injury of skin 05/30/2024   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   Ventilator dependence (HCC) 05/30/2024   ALS (amyotrophic lateral sclerosis) (HCC) 05/30/2024   Alteration in electrolyte and fluid balance 05/30/2024   Protein-calorie malnutrition, severe 05/28/2024   Foot pain 10/10/2013   Neuralgia of right foot 10/10/2013    Orientation RESPIRATION BLADDER Height & Weight     Self, Situation, Time, Place  Tracheostomy, Vent Incontinent Weight: 91 lb 0.8 oz (41.3 kg) Height:  5' (152.4 cm)  BEHAVIORAL SYMPTOMS/MOOD NEUROLOGICAL BOWEL NUTRITION STATUS      Continent Diet, Feeding tube (see d/c summary)  AMBULATORY STATUS COMMUNICATION OF NEEDS Skin   Extensive Assist Verbally PU Stage and Appropriate Care, Other (Comment) (pressure injury stage 3 coccyx mid; other labia right)                       Personal Care Assistance Level of Assistance  Bathing, Dressing, Feeding Bathing Assistance: Maximum assistance Feeding assistance: Maximum  assistance Dressing Assistance: Maximum assistance     Functional Limitations Info  Sight, Hearing, Speech Sight Info: Adequate Hearing Info: Impaired Speech Info: Impaired    SPECIAL CARE FACTORS FREQUENCY                       Contractures Contractures Info: Not present    Additional Factors Info  Code Status, Allergies Code Status Info: Full code Allergies Info: Iodine, shellfish allergy, Zymaxid (gatifloxacin), Aleve (naproxen), amlodipine, Adoxa (doxycycline), codeine, Ak-mycin (erythromycin) L, Benadryl Itch Stopping (diphenhydramine-zinc Acetate), Penicillins           Current Medications (07/14/2024):  This is the current hospital active medication list Current Facility-Administered Medications  Medication Dose Route Frequency Provider Last Rate Last Admin   acetaminophen  (TYLENOL ) tablet 650 mg  650 mg Per Tube Q4H PRN Rashid, Farhan, MD   650 mg at 07/11/24 1219   arformoterol  (BROVANA ) nebulizer solution 15 mcg  15 mcg Nebulization BID Smith, Daniel C, MD   15 mcg at 07/14/24 9247   Chlorhexidine  Gluconate Cloth 2 % PADS 6 each  6 each Topical Daily Maree Harder, MD   6 each at 07/14/24 0817   diltiazem  (CARDIZEM ) tablet 30 mg  30 mg Per Tube Q8H Rashid, Farhan, MD   30 mg at 07/14/24 9367   enoxaparin  (LOVENOX ) injection 30 mg  30 mg Subcutaneous Q24H Smith, Joshua C, NP   30 mg at 07/14/24 1011   famotidine  (PEPCID ) 40 MG/5ML suspension 20 mg  20 mg Per Tube Daily Rashid, Farhan, MD   20 mg  at 07/13/24 1005   feeding supplement (OSMOLITE 1.2 CAL) liquid 1,000 mL  1,000 mL Per Tube Continuous Smith, Daniel C, MD 50 mL/hr at 07/14/24 0800 Infusion Verify at 07/14/24 0800   feeding supplement (PROSource TF20) liquid 60 mL  60 mL Per Tube Daily Claudene Toribio BROCKS, MD   60 mL at 07/14/24 1011   free water  150 mL  150 mL Per Tube Q6H Dino Antu, MD   150 mL at 07/14/24 9367   Gerhardt's butt cream   Topical PRN Claudene Toribio BROCKS, MD   Given at 07/13/24 1441    HYDROmorphone  (DILAUDID ) injection 0.5-2 mg  0.5-2 mg Intravenous Q4H PRN Smith, Daniel C, MD   1 mg at 07/09/24 1816   hydrOXYzine  (ATARAX ) 10 MG/5ML syrup 10 mg  10 mg Per Tube TID PRN Rashid, Farhan, MD   10 mg at 07/14/24 0256   insulin  aspart (novoLOG ) injection 0-9 Units  0-9 Units Subcutaneous Q4H Maree Harder, MD   1 Units at 07/13/24 1227   ipratropium-albuterol  (DUONEB) 0.5-2.5 (3) MG/3ML nebulizer solution 3 mL  3 mL Nebulization Q4H PRN Claudene Toribio BROCKS, MD   3 mL at 07/09/24 1806   labetalol  (NORMODYNE ) injection 10 mg  10 mg Intravenous Q2H PRN Sundil, Subrina, MD       latanoprost  (XALATAN ) 0.005 % ophthalmic solution 1 drop  1 drop Both Eyes QHS Smith, Joshua C, NP   1 drop at 07/13/24 2117   LORazepam  (ATIVAN ) injection 1 mg  1 mg Intravenous Q4H PRN Autry, Lauren E, PA-C   1 mg at 07/12/24 1618   midodrine  (PROAMATINE ) tablet 5 mg  5 mg Per Tube Q12H Rashid, Farhan, MD   5 mg at 07/14/24 1012   nutrition supplement (JUVEN) (JUVEN) powder packet 1 packet  1 packet Per Tube BID BM Rashid, Farhan, MD   1 packet at 07/14/24 1011   ondansetron  (ZOFRAN ) injection 4 mg  4 mg Intravenous Q6H PRN Maree Harder, MD       Oral care mouth rinse  15 mL Mouth Rinse Q2H Shah, Naveed, MD   15 mL at 07/14/24 1056   Oral care mouth rinse  15 mL Mouth Rinse PRN Maree Harder, MD       QUEtiapine  (SEROQUEL ) tablet 25 mg  25 mg Oral QHS Rashid, Farhan, MD       revefenacin  (YUPELRI ) nebulizer solution 175 mcg  175 mcg Nebulization Daily Claudene Toribio BROCKS, MD   175 mcg at 07/14/24 9247   senna (SENOKOT) tablet 8.6 mg  1 tablet Per Tube Daily Claudene Toribio BROCKS, MD       sertraline  (ZOLOFT ) tablet 50 mg  50 mg Per Tube Daily [place], Peter M, MD   50 mg at 07/14/24 1011   sodium chloride  flush (NS) 0.9 % injection 10-40 mL  10-40 mL Intracatheter PRN Rashid, Farhan, MD       thiamine  (VITAMIN B1) tablet 100 mg  100 mg Per Tube Daily Smith, Joshua C, NP   100 mg at 07/14/24 1011     Discharge  Medications: Please see discharge summary for a list of discharge medications.  Relevant Imaging Results:  Relevant Lab Results:   Additional Information SSN [id number] [place], [place]

## 2024-07-14 NOTE — Plan of Care (Addendum)
 Patient to discharge to [facility]   Problem: Education: Goal: Ability to describe self-care measures that may prevent or decrease complications (Diabetes Survival Skills Education) will improve Outcome: Progressing Goal: Individualized Educational Video(s) Outcome: Progressing   Problem: Coping: Goal: Ability to adjust to condition or change in health will improve Outcome: Progressing   Problem: Fluid Volume: Goal: Ability to maintain a balanced intake and output will improve Outcome: Progressing   Problem: Health Behavior/Discharge Planning: Goal: Ability to identify and utilize available resources and services will improve Outcome: Progressing Goal: Ability to manage health-related needs will improve Outcome: Progressing   Problem: Metabolic: Goal: Ability to maintain appropriate glucose levels will improve Outcome: Progressing   Problem: Nutritional: Goal: Maintenance of adequate nutrition will improve Outcome: Progressing Goal: Progress toward achieving an optimal weight will improve Outcome: Progressing   Problem: Skin Integrity: Goal: Risk for impaired skin integrity will decrease Outcome: Progressing   Problem: Tissue Perfusion: Goal: Adequacy of tissue perfusion will improve Outcome: Progressing   Problem: Education: Goal: Knowledge of General Education information will improve Description: Including pain rating scale, medication(s)/side effects and non-pharmacologic comfort measures Outcome: Progressing   Problem: Health Behavior/Discharge Planning: Goal: Ability to manage health-related needs will improve Outcome: Progressing   Problem: Clinical Measurements: Goal: Ability to maintain clinical measurements within normal limits will improve Outcome: Progressing Goal: Will remain free from infection Outcome: Progressing Goal: Diagnostic test results will improve Outcome: Progressing Goal: Respiratory complications will improve Outcome:  Progressing Goal: Cardiovascular complication will be avoided Outcome: Progressing   Problem: Activity: Goal: Risk for activity intolerance will decrease Outcome: Progressing   Problem: Nutrition: Goal: Adequate nutrition will be maintained Outcome: Progressing   Problem: Coping: Goal: Level of anxiety will decrease Outcome: Progressing   Problem: Elimination: Goal: Will not experience complications related to bowel motility Outcome: Progressing Goal: Will not experience complications related to urinary retention Outcome: Progressing   Problem: Pain Managment: Goal: General experience of comfort will improve and/or be controlled Outcome: Progressing   Problem: Safety: Goal: Ability to remain free from injury will improve Outcome: Progressing   Problem: Skin Integrity: Goal: Risk for impaired skin integrity will decrease Outcome: Progressing

## 2024-07-14 NOTE — OR Nursing (Addendum)
 Karna Catholic called and notified of patient discharge and tx to [facility]. Verbalizes understanding.  [facility] at bedside to tx patient, all monitors and lines removed per protocol. Discharge summary and AVS handed to [facility].  Patient belongings including glasses, cellphone, and charger placed in bag and [facility] aware.

## 2024-07-14 NOTE — Discharge Summary (Signed)
 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 She has a h/o A fib and was in A fib with RVR on admission started on Cardizem  drip. Cxr showing pneumonia. Normal lactic acid and increased WBC 17, Cr 0.35. She has a h/o stenotrophomonas maltophilia lung infection. She is on 40% FiO2 and PEEP of 5 on PRVC mode on mechanical ventilation.  Septic shock,POA: Resolved now Etiology appears to be possible bilateral bacterial pneumonia Off of pressors and stress dose steroids Midodrine  was started but dced on 7/16 because of elevated BP. No growth to date on blood cultures Dced vanc. Finished course of IV Cefepime  Unremarkable respiratory panel and UA Lactic acidosis has resolved.     Anxiety: Started on Precedex  on 07/10/2024 and dced later. Continue with as needed atarax , patient refused ativan . Started on zoloft  and low dose seroquel .   Atrial fibrillation with RVR,POA: S/p cardizem  drip. She is in sinus rhythm now. TSH-Normal   Intermittent narrow complex tachycardia: Continue with cardizem . She is in sinus rhythm.   Acute on chronic hypoxic respiratory failure in the setting of chronic trach and vent dependency,POA: She is back on her baseline FiO2 of 40%, PEEP 5 on AC mode Continue with inhalational bronchodilator therapy Goal sat 92% and above Monitor Peak and Plateau pressures   ALS,POA:  Continue with supportive care.   Nutrition: PEG tube in place and currently on tube feeds with Osmolite at 50 cc/h.SABRA No significant residuals.   Moderate protein calorie malnutrition: As evidenced by low albumin  and low BMI. On tube feeds   DVT prophylaxis: Lovenox    GI prophylaxis: Pepcid    Disposition:[facility]      Consultants: Critical care Procedures performed: Tracheostomy change 7/17 Disposition: Skilled nursing facility Diet recommendation:  NPO Tube feeds via PEG tube with osmolite DISCHARGE MEDICATION: Allergies as of 07/14/2024       Reactions   Iodine Anaphylaxis   Shellfish Allergy Anaphylaxis   Due to iodine content   Zymaxid [gatifloxacin] Other (See Comments)   Weakness to legs causing frequent falls; inability to stand or walk   Amlodipine Swelling   Leg and ankle swelling   Aleve [naproxen] Swelling   Adoxa [doxycycline] Other (See Comments)   Dizziness   Ak-mycin [erythromycin] Nausea Only   Benadryl Itch Stopping [diphenhydramine-zinc Acetate] Rash   Blistering rash Reaction to topical diphenhydramine   Codeine Other (See Comments)   Unknown reaction   Penicillins Rash   Fever and rash Tolerated cephalosporins         Medication List     STOP taking these medications    LORazepam  0.5 MG tablet Commonly known as: ATIVAN        TAKE these medications    acetaminophen  325 MG tablet Commonly known as: TYLENOL  Place 650 mg into feeding tube every 6 (six) hours as needed (pain).   arformoterol  15 MCG/2ML Nebu Commonly known as:  BROVANA  Take 2 mLs (15 mcg total) by nebulization 2 (two) times daily.   carboxymethylcellulose 0.5 % Soln Commonly known as: REFRESH PLUS Place 1 drop into both eyes every 6 (six) hours as needed (dry eye).   Chlorhexidine  Gluconate Cloth 2 % Pads Apply 6 each topically daily. Start taking on: July 15, 2024   diltiazem  30 MG tablet Commonly known as: CARDIZEM  Place 1 tablet (30 mg total) into feeding  tube every 8 (eight) hours.   enoxaparin  30 MG/0.3ML injection Commonly known as: LOVENOX  Inject 0.3 mLs (30 mg total) into the skin daily.   famotidine  20 MG tablet Commonly known as: PEPCID  Place 20 mg into feeding tube daily.   feeding supplement (PROSource TF20) liquid Place 60 mLs into feeding tube daily. Start taking on: July 15, 2024   free water  Soln Place 150 mLs into feeding tube every 6 (six) hours.   Gerhardt's butt cream Crea Apply 1 Application topically as needed for irritation.   guaiFENesin 200 MG tablet Place 200 mg into feeding tube every 6 (six) hours as needed for cough or to loosen phlegm.   hydrOXYzine  10 MG/5ML syrup Commonly known as: ATARAX  Place 5 mLs (10 mg total) into feeding tube 3 (three) times daily as needed (breakthrough anxiety).   Incruse Ellipta  62.5 MCG/ACT Aepb Generic drug: umeclidinium bromide  Inhale 1 puff into the lungs daily.   ipratropium-albuterol  0.5-2.5 (3) MG/3ML Soln Commonly known as: DUONEB Take 3 mLs by nebulization every 4 (four) hours as needed.   JEVITY 1.5 CAL PO 55 mL/hr by Feeding Tube route continuous. May be off for up to 2 hours daily for ADL's and activities. Total for day 1210-1314mLs (1815-1980kCal).   latanoprost  0.005 % ophthalmic solution Commonly known as: XALATAN  Place 1 drop into both eyes at bedtime.   loperamide 2 MG tablet Commonly known as: IMODIUM A-D Place 2 mg into feeding tube every 6 (six) hours as needed for diarrhea or loose stools.   midodrine  5 MG tablet Commonly known as: PROAMATINE  Place 3 tablets (15 mg total) into feeding tube every 8 (eight) hours.   mouth rinse Liqd solution 15 mLs by Mouth Rinse route as needed (oral care).   ondansetron  4 MG tablet Commonly known as: ZOFRAN  Place 4 mg into feeding tube every 6 (six) hours as needed for nausea or vomiting.   QUEtiapine  25 MG tablet Commonly known as: SEROQUEL  Place 1 tablet (25 mg total) into feeding tube at bedtime.    senna 8.6 MG Tabs tablet Commonly known as: SENOKOT Place 1 tablet (8.6 mg total) into feeding tube daily. Start taking on: July 15, 2024   sertraline  50 MG tablet Commonly known as: ZOLOFT  Place 1 tablet (50 mg total) into feeding tube daily. Start taking on: July 15, 2024   thiamine  100 MG tablet Commonly known as: VITAMIN B1 Place 100 mg into feeding tube daily.        Follow-up Information     Fleeta Valeria Mayo, MD. Call.   Specialty: Internal Medicine Contact information: [address] [place] [address] [phone]                Discharge Exam: Fredricka Weights   07/12/24 0500 07/13/24 0429 07/14/24 0500  Weight: 42.7 kg 41.5 kg 41.3 kg   Constitutional: Trach, on vent,  unable to talk because of the trach, on vent, alert and awake, communicated by writing Eyes: PERRL, lids and conjunctivae normal ENMT: Mucous membranes are moist. Posterior pharynx clear  of any exudate or lesions.Normal dentition.  Neck: Trach in place Respiratory: clear to auscultation bilaterally, no wheezing, no crackles. Normal respiratory effort. No accessory muscle use.  Cardiovascular: Regular rate and rhythm, no murmurs / rubs / gallops. No extremity edema. 2+ pedal pulses. No carotid bruits.  Abdomen: no tenderness, no masses palpated. No hepatosplenomegaly. Bowel sounds positive.  Musculoskeletal: no clubbing / cyanosis. No joint deformity upper and lower extremities. Good ROM, no contractures. Normal muscle tone.  Skin: no rashes, lesions, ulcers. No induration Neurologic: CN 2-12 grossly intact. Sensation intact, DTR normal. Strength 4/5 x all 4 extremities.   Condition at discharge: fair  The results of significant diagnostics from this hospitalization (including imaging, microbiology, ancillary and laboratory) are listed below for reference.   Imaging Studies: DG Chest Port 1 View Result Date: 07/11/2024 EXAM: 1 VIEW XRAY OF THE CHEST 07/11/2024 01:41:34 AM COMPARISON:  07/09/2024 CLINICAL HISTORY: [id number] SOB (shortness of breath) [id number]. SOB (shortness of breath) [id number] FINDINGS: LUNGS AND PLEURA: Diffuse bilateral interstitial and airspace opacities with a mid and lower lung predominance are similar to prior. Probable small bilateral pleural effusions. No pneumothorax. HEART AND MEDIASTINUM: Stable cardiomediastinal silhouette. Aortic atherosclerotic calcification. BONES AND SOFT TISSUES: No acute osseous abnormality. Tracheostomy. IMPRESSION: 1. Bilateral interstitial and airspace opacities with a mid and lower lung predominance, similar to prior. 2. Small bilateral pleural effusions. Electronically signed by: Norman Gatlin MD 07/11/2024 01:52 AM EDT RP Workstation: [id number]   DG Chest Port 1 View Result Date: 07/09/2024 CLINICAL DATA:  Shortness of breath EXAM: PORTABLE CHEST 1 VIEW COMPARISON:  07/06/2024 FINDINGS: Heart size is normal. Tracheostomy. Dependent bibasilar heterogeneous airspace opacity and small, layering pleural effusions. No acute osseous findings. IMPRESSION: Dependent bibasilar heterogeneous airspace opacity and small, layering pleural effusions, most consistent with edema. Electronically Signed   By: Marolyn JONETTA Jaksch M.D.   On: 07/09/2024 18:39   DG Abd 1 View Result Date: 07/08/2024 EXAM: 1 VIEW XRAY OF THE ABDOMEN 07/08/2024 08:36:00 PM COMPARISON: None available. CLINICAL HISTORY: Distended abdomen FINDINGS: BOWEL: Gas-filled loops of small and large bowel in the abdomen without evidence of obstruction. SOFT TISSUES: No abnormal calcifications or opaque urinary calculi. BONES: No acute osseous abnormality. IMPRESSION: 1. No evidence of bowel obstruction. Electronically signed by: Norman Gatlin MD 07/08/2024 10:00 PM EDT RP Workstation: [id number]   DG Chest Port 1 View Result Date: 07/06/2024 CLINICAL DATA:  Questionable sepsis-evaluate for abnormality. Tachycardia. EXAM: PORTABLE CHEST 1 VIEW COMPARISON:  05/29/2024 FINDINGS: Tracheostomy.  Stable cardiomediastinal silhouette. Aortic atherosclerotic calcification. Bronchial wall thickening. Patchy bilateral airspace and interstitial opacities no pleural effusion or pneumothorax. No displaced rib fractures. IMPRESSION: Findings are suspicious for bronchopneumonia and/or aspiration. Electronically Signed   By: Norman Gatlin M.D.   On: 07/06/2024 21:37    Microbiology: Results for orders placed or performed during the hospital encounter of 07/06/24  MRSA Next Gen by PCR, Nasal     Status: None   Collection Time: 07/07/24  1:54 AM  Result Value Ref Range Status   MRSA by PCR Next Gen NOT DETECTED NOT DETECTED Final    Comment: (NOTE) The GeneXpert MRSA Assay (FDA approved for NASAL specimens only), is one component of a comprehensive MRSA colonization surveillance program. It is not intended to diagnose MRSA infection nor to guide or monitor treatment for MRSA infections. Test performance is not FDA approved in patients less than 33 years old. Performed at [facility], [address], [place], [place] [address]   Blood Culture (routine  x 2)     Status: None   Collection Time: 07/07/24  4:46 AM   Specimen: BLOOD  Result Value Ref Range Status   Specimen Description BLOOD BLOOD RIGHT HAND  Final   Special Requests   Final    BOTTLES DRAWN AEROBIC AND ANAEROBIC Blood Culture results may not be optimal due to an inadequate volume of blood received in culture bottles   Culture   Final    NO GROWTH 5 DAYS Performed at [facility], [address], [place], [place] [address]    Report Status 07/12/2024 FINAL  Final  Blood Culture (routine x 2)     Status: None   Collection Time: 07/07/24  4:55 AM   Specimen: BLOOD  Result Value Ref Range Status   Specimen Description BLOOD BLOOD LEFT HAND  Final   Special Requests   Final    BOTTLES DRAWN AEROBIC AND ANAEROBIC Blood Culture results may not be optimal due to an inadequate volume of blood received in culture  bottles   Culture   Final    NO GROWTH 5 DAYS Performed at [facility], [address], [place], [place] [address]    Report Status 07/12/2024 FINAL  Final    Labs: CBC: Recent Labs  Lab 07/08/24 0331 07/09/24 0338 07/10/24 0150 07/11/24 0700 07/12/24 0415  WBC 14.8* 13.3* 7.6 9.6 8.3  HGB 8.7* 11.1* 10.0* 11.3* 9.9*  HCT 27.2* 34.9* 31.3* 35.5* 30.8*  MCV 87.5 87.5 86.7 86.4 86.3  PLT 278 342 315 359 354   Basic Metabolic Panel: Recent Labs  Lab 07/08/24 0331 07/09/24 0337 07/09/24 0338 07/10/24 0150 07/10/24 0151 07/11/24 0700 07/11/24 0702 07/12/24 0415 07/12/24 0416 07/13/24 0245 07/14/24 0302  NA 138   < >  --   --  140  --  140  --  138 138 139  K 3.7   < >  --   --  4.1  --  4.3  --  4.5 4.7 4.4  CL 102   < >  --   --  102  --  96*  --  97* 95* 96*  CO2 26   < >  --   --  30  --  32  --  34* 34* 33*  GLUCOSE 118*   < >  --   --  129*  --  76  --  124* 128* 122*  BUN 20   < >  --   --  21  --  24*  --  24* 29* 23  CREATININE 0.30*   < >  --   --  <0.30*  --  0.36*  --  0.37* 0.32* 0.33*  CALCIUM 9.7   < >  --   --  9.1  --  9.5  --  9.3 9.3 9.9  MG 1.8  --  1.8 1.7  --  1.9  --  1.7  --   --   --   PHOS 3.0   < > 2.0*  --  2.2* 3.4 3.4 3.1 3.2 3.8 3.8   < > = values in this interval not displayed.   Liver Function Tests: Recent Labs  Lab 07/10/24 0151 07/11/24 0702 07/12/24 0416 07/13/24 0245 07/14/24 0302  ALBUMIN  2.6* 3.0* 2.6* 2.7* 3.2*   CBG: Recent Labs  Lab 07/13/24 1503 07/13/24 1954 07/13/24 2337 07/14/24 0249 07/14/24 0755  GLUCAP 114* 97 110* 119* 103*    Discharge time spent:  45 minutes Signed: Deliliah Room, MD [facility] 07/14/2024

## 2024-07-14 NOTE — Evaluation (Signed)
 Physical Therapy Evaluation Patient Details Name: Dominique Brewer MRN: [id number] DOB: July 12, 1946 Today's Date: 07/14/2024  History of Present Illness  78 yo female admitted 07/06/24 from [facility] in Afib with RVR . PMHx: trach, PEG, Afib, ALS, HTN, HLD, atrial tachycardia  Clinical Impression  Pt pleasant mouthing words and at times speaking over vent. Pt reports no working with therapy for the last month at [facility] and wanting to get up. Pt able to walk short distance with RW on vent with RT assist and RN present for chair follow. Pt with limitations due to anxiety with VSS throughout on 40% FIO2. Pt with decreased strength, balance, activity tolerance and cardiopulmonary function who will benefit from continued therapy to maximize mobility, safety and independence to decrease burden of care and maximize quality of life. Encouraged OOB daily with nursing staff.   HR 93-105 SPO2 99%, vent PRVC 40%, peep 5 98/58 (70)        If plan is discharge home, recommend the following: A little help with walking and/or transfers;A little help with bathing/dressing/bathroom;Assistance with cooking/housework   Can travel by Administrator walker (2 wheels);Wheelchair (measurements PT);Wheelchair cushion (measurements PT);BSC/3in1  Recommendations for Other Services       Functional Status Assessment Patient has had a recent decline in their functional status and demonstrates the ability to make significant improvements in function in a reasonable and predictable amount of time.     Precautions / Restrictions Precautions Precautions: Other (comment);Fall Recall of Precautions/Restrictions: Intact Precaution/Restrictions Comments: vent, trach, PEG      Mobility  Bed Mobility Overal bed mobility: Needs Assistance Bed Mobility: Supine to Sit     Supine to sit: Contact guard, HOB elevated     General bed mobility comments: CGA for lines, safety  and cues to sequence    Transfers Overall transfer level: Needs assistance   Transfers: Sit to/from Stand Sit to Stand: Min assist           General transfer comment: min assist to rise from bed x1 from chair x5    Ambulation/Gait Ambulation/Gait assistance: Contact guard assist, +2 safety/equipment Gait Distance (Feet): 20 Feet Assistive device: Rolling walker (2 wheels) Gait Pattern/deviations: Step-through pattern, Decreased stride length, Narrow base of support   Gait velocity interpretation: <1.8 ft/sec, indicate of risk for recurrent falls   General Gait Details: cues for increased BOS and stride,+3 assist for vent and chair follow. Pt walked 5', 10', 15', 15', 20' respectively with seated rest between trials  Stairs            Wheelchair Mobility     Tilt Bed    Modified Rankin (Stroke Patients Only)       Balance Overall balance assessment: Needs assistance Sitting-balance support: No upper extremity supported, Feet supported Sitting balance-Leahy Scale: Fair     Standing balance support: Bilateral upper extremity supported Standing balance-Leahy Scale: Poor Standing balance comment: RW in standing                             Pertinent Vitals/Pain Pain Assessment Pain Assessment: No/denies pain    Home Living Family/patient expects to be discharged to:: Skilled nursing facility                        Prior Function Prior Level of Function : Needs assist  Mobility Comments: pt reports hasn't been getting up at SNF for the last month ADLs Comments: assist for ADLs and staff does IADLs     Extremity/Trunk Assessment   Upper Extremity Assessment Upper Extremity Assessment: Generalized weakness    Lower Extremity Assessment Lower Extremity Assessment: Generalized weakness    Cervical / Trunk Assessment Cervical / Trunk Assessment: Normal  Communication   Communication Communication:  Impaired Factors Affecting Communication: Trach/intubated    Cognition Arousal: Alert Behavior During Therapy: Anxious   PT - Cognitive impairments: No apparent impairments                         Following commands: Intact       Cueing Cueing Techniques: Verbal cues, Gestural cues     General Comments      Exercises     Assessment/Plan    PT Assessment Patient needs continued PT services  PT Problem List Decreased strength;Decreased activity tolerance;Decreased mobility;Decreased knowledge of use of DME;Cardiopulmonary status limiting activity       PT Treatment Interventions DME instruction;Balance training;Gait training;Functional mobility training;Therapeutic activities;Therapeutic exercise;Patient/family education    PT Goals (Current goals can be found in the Care Plan section)  Acute Rehab PT Goals Patient Stated Goal: walk and get stronger PT Goal Formulation: With patient Time For Goal Achievement: 07/28/24 Potential to Achieve Goals: Good    Frequency Min 2X/week     Co-evaluation               AM-PAC PT 6 Clicks Mobility  Outcome Measure Help needed turning from your back to your side while in a flat bed without using bedrails?: A Little Help needed moving from lying on your back to sitting on the side of a flat bed without using bedrails?: A Little Help needed moving to and from a bed to a chair (including a wheelchair)?: A Little Help needed standing up from a chair using your arms (e.g., wheelchair or bedside chair)?: A Little Help needed to walk in hospital room?: Total Help needed climbing 3-5 steps with a railing? : Total 6 Click Score: 14    End of Session Equipment Utilized During Treatment: Gait belt Activity Tolerance: Patient tolerated treatment well Patient left: in chair;with call bell/phone within reach;with nursing/sitter in room Nurse Communication: Mobility status PT Visit Diagnosis: Other abnormalities of gait  and mobility (R26.89);Difficulty in walking, not elsewhere classified (R26.2);Muscle weakness (generalized) (M62.81)    Time: 8940-8861 PT Time Calculation (min) (ACUTE ONLY): 39 min   Charges:   PT Evaluation $PT Eval High Complexity: 1 High PT Treatments $Gait Training: 8-22 mins $Therapeutic Activity: 8-22 mins PT General Charges $$ ACUTE PT VISIT: 1 Visit         Lenoard SQUIBB, PT Acute Rehabilitation Services Office: [phone]   Lenoard NOVAK Tashanti Dalporto 07/14/2024, 12:14 PM

## 2024-07-14 NOTE — Plan of Care (Signed)
  Problem: Fluid Volume: Goal: Ability to maintain a balanced intake and output will improve Outcome: Progressing   Problem: Nutritional: Goal: Maintenance of adequate nutrition will improve Outcome: Progressing   Problem: Skin Integrity: Goal: Risk for impaired skin integrity will decrease Outcome: Progressing   Problem: Tissue Perfusion: Goal: Adequacy of tissue perfusion will improve Outcome: Progressing   Problem: Clinical Measurements: Goal: Will remain free from infection Outcome: Progressing Goal: Diagnostic test results will improve Outcome: Progressing Goal: Respiratory complications will improve Outcome: Progressing Goal: Cardiovascular complication will be avoided Outcome: Progressing

## 2024-07-14 NOTE — TOC Transition Note (Addendum)
 Transition of Care [facility]) - Discharge Note   Patient Details  Name: Dominique Brewer MRN: [id number] Date of Birth: 1946-11-11  [facility] CM/SW Contact:  Luann SHAUNNA Cumming, LCSW Phone Number: 07/14/2024, 11:05 AM   Clinical Narrative:     CSW notified that pt is medically stable though wanting to pursue other SNFs than [facility]. Pt is on a ventilator and is a resident of [facility]. CSW met with pt bedside; she communicated by writing on note pad. CSW explained that she is medically ready and has a safe disposition at [facility]. CSW explained that pt could not be held when medically ready and that pt would need to work with Child psychotherapist at [facility] to assist with alternative placements. Pt is agreeable with this plan. She is aware of the two other vent snf's and explains she prefers [facility]; CSW agrees to update [facility] and request their Child psychotherapist meet with pt.   CSW notified RN and MD. Updated [facility]. Plan will be to DC to [facility] today. CSW called and left message with pt's sister, Karna.   RN to call report to [phone]. Accepting doctor is Dr. Fleeta Finger. RN to arrange [facility] transport  Final next level of care: Skilled Nursing Facility Barriers to Discharge: No Barriers Identified   Patient Goals and CMS Choice Patient states their goals for this hospitalization and ongoing recovery are:: want to get better CMS Medicare.gov Compare Post Acute Care list provided to:: Patient Choice offered to / list presented to : Patient      Discharge Placement                       Discharge Plan and Services Additional resources added to the After Visit Summary for     Discharge Planning Services: CM Consult Post Acute Care Choice: Long Term Acute Care (LTAC)                               Social Drivers of Health (SDOH) Interventions SDOH Screenings   Food Insecurity: Patient Unable To Answer (07/07/2024)  Housing: Unknown (07/07/2024)   Transportation Needs: Patient Unable To Answer (07/07/2024)  Utilities: Patient Unable To Answer (07/07/2024)  Financial Resource Strain: High Risk (06/08/2023)   Received from [facility]  Physical Activity: Unknown (12/08/2022)   [facility] from [facility]  Social Connections: Patient Unable To Answer (07/07/2024)  Stress: No Stress Concern Present (12/08/2022)   Received from [facility]  Tobacco Use: Low Risk  (07/06/2024)     Readmission Risk Interventions     No data to display

## 2024-07-15 DIAGNOSIS — R5381 Other malaise: Secondary | ICD-10-CM | POA: Diagnosis not present

## 2024-07-15 DIAGNOSIS — R627 Adult failure to thrive: Secondary | ICD-10-CM | POA: Diagnosis not present

## 2024-07-15 DIAGNOSIS — E43 Unspecified severe protein-calorie malnutrition: Secondary | ICD-10-CM | POA: Diagnosis not present

## 2024-07-15 DIAGNOSIS — J9602 Acute respiratory failure with hypercapnia: Secondary | ICD-10-CM | POA: Diagnosis not present

## 2024-07-15 DIAGNOSIS — G1221 Amyotrophic lateral sclerosis: Secondary | ICD-10-CM

## 2024-07-22 DIAGNOSIS — R627 Adult failure to thrive: Secondary | ICD-10-CM | POA: Diagnosis not present

## 2024-07-22 DIAGNOSIS — R5381 Other malaise: Secondary | ICD-10-CM | POA: Diagnosis not present

## 2024-07-22 DIAGNOSIS — G1221 Amyotrophic lateral sclerosis: Secondary | ICD-10-CM | POA: Diagnosis not present

## 2024-07-22 DIAGNOSIS — E43 Unspecified severe protein-calorie malnutrition: Secondary | ICD-10-CM

## 2024-07-22 DIAGNOSIS — J9602 Acute respiratory failure with hypercapnia: Secondary | ICD-10-CM | POA: Diagnosis not present

## 2024-07-29 DIAGNOSIS — E43 Unspecified severe protein-calorie malnutrition: Secondary | ICD-10-CM | POA: Diagnosis not present

## 2024-07-29 DIAGNOSIS — R627 Adult failure to thrive: Secondary | ICD-10-CM | POA: Diagnosis not present

## 2024-07-29 DIAGNOSIS — G1221 Amyotrophic lateral sclerosis: Secondary | ICD-10-CM | POA: Diagnosis not present

## 2024-07-29 DIAGNOSIS — J9602 Acute respiratory failure with hypercapnia: Secondary | ICD-10-CM | POA: Diagnosis not present

## 2024-07-29 DIAGNOSIS — R5381 Other malaise: Secondary | ICD-10-CM

## 2024-08-05 DIAGNOSIS — E43 Unspecified severe protein-calorie malnutrition: Secondary | ICD-10-CM | POA: Diagnosis not present

## 2024-08-05 DIAGNOSIS — G1221 Amyotrophic lateral sclerosis: Secondary | ICD-10-CM | POA: Diagnosis not present

## 2024-08-05 DIAGNOSIS — R627 Adult failure to thrive: Secondary | ICD-10-CM | POA: Diagnosis not present

## 2024-08-05 DIAGNOSIS — R5381 Other malaise: Secondary | ICD-10-CM

## 2024-08-05 DIAGNOSIS — J9602 Acute respiratory failure with hypercapnia: Secondary | ICD-10-CM | POA: Diagnosis not present

## 2024-08-06 DIAGNOSIS — R000 Tachycardia, unspecified: Secondary | ICD-10-CM | POA: Diagnosis not present

## 2024-08-12 DIAGNOSIS — R627 Adult failure to thrive: Secondary | ICD-10-CM | POA: Diagnosis not present

## 2024-08-12 DIAGNOSIS — E43 Unspecified severe protein-calorie malnutrition: Secondary | ICD-10-CM

## 2024-08-12 DIAGNOSIS — R5381 Other malaise: Secondary | ICD-10-CM | POA: Diagnosis not present

## 2024-08-12 DIAGNOSIS — G1221 Amyotrophic lateral sclerosis: Secondary | ICD-10-CM | POA: Diagnosis not present

## 2024-08-12 DIAGNOSIS — J9602 Acute respiratory failure with hypercapnia: Secondary | ICD-10-CM | POA: Diagnosis not present

## 2024-08-19 DIAGNOSIS — E43 Unspecified severe protein-calorie malnutrition: Secondary | ICD-10-CM | POA: Diagnosis not present

## 2024-08-19 DIAGNOSIS — R627 Adult failure to thrive: Secondary | ICD-10-CM | POA: Diagnosis not present

## 2024-08-19 DIAGNOSIS — R5381 Other malaise: Secondary | ICD-10-CM

## 2024-08-19 DIAGNOSIS — J9602 Acute respiratory failure with hypercapnia: Secondary | ICD-10-CM | POA: Diagnosis not present

## 2024-08-19 DIAGNOSIS — G1221 Amyotrophic lateral sclerosis: Secondary | ICD-10-CM | POA: Diagnosis not present

## 2024-08-26 DIAGNOSIS — J9602 Acute respiratory failure with hypercapnia: Secondary | ICD-10-CM | POA: Diagnosis not present

## 2024-08-26 DIAGNOSIS — R5381 Other malaise: Secondary | ICD-10-CM

## 2024-08-26 DIAGNOSIS — G1221 Amyotrophic lateral sclerosis: Secondary | ICD-10-CM | POA: Diagnosis not present

## 2024-08-26 DIAGNOSIS — R627 Adult failure to thrive: Secondary | ICD-10-CM | POA: Diagnosis not present

## 2024-08-26 DIAGNOSIS — E43 Unspecified severe protein-calorie malnutrition: Secondary | ICD-10-CM | POA: Diagnosis not present

## 2024-09-02 DIAGNOSIS — R627 Adult failure to thrive: Secondary | ICD-10-CM | POA: Diagnosis not present

## 2024-09-02 DIAGNOSIS — G1221 Amyotrophic lateral sclerosis: Secondary | ICD-10-CM | POA: Diagnosis not present

## 2024-09-02 DIAGNOSIS — E43 Unspecified severe protein-calorie malnutrition: Secondary | ICD-10-CM

## 2024-09-02 DIAGNOSIS — J9602 Acute respiratory failure with hypercapnia: Secondary | ICD-10-CM | POA: Diagnosis not present

## 2024-09-02 DIAGNOSIS — R5381 Other malaise: Secondary | ICD-10-CM | POA: Diagnosis not present

## 2024-09-09 DIAGNOSIS — J9602 Acute respiratory failure with hypercapnia: Secondary | ICD-10-CM | POA: Diagnosis not present

## 2024-09-09 DIAGNOSIS — R5381 Other malaise: Secondary | ICD-10-CM

## 2024-09-09 DIAGNOSIS — E43 Unspecified severe protein-calorie malnutrition: Secondary | ICD-10-CM | POA: Diagnosis not present

## 2024-09-09 DIAGNOSIS — R627 Adult failure to thrive: Secondary | ICD-10-CM | POA: Diagnosis not present

## 2024-09-09 DIAGNOSIS — G1221 Amyotrophic lateral sclerosis: Secondary | ICD-10-CM | POA: Diagnosis not present

## 2024-10-09 ENCOUNTER — Institutional Professional Consult (permissible substitution) (HOSPITAL_COMMUNITY)

## 2024-10-09 ENCOUNTER — Inpatient Hospital Stay
Admission: AD | Admit: 2024-10-09 | Discharge: 2025-01-13 | Disposition: A | Discharge status: Still In Hospital | Source: Other Acute Inpatient Hospital | Attending: Internal Medicine | Admitting: Internal Medicine

## 2024-10-09 LAB — BLOOD GAS, ARTERIAL
Acid-Base Excess: 5.1 mmol/L — ABNORMAL HIGH (ref 0.0–2.0)
Bicarbonate: 32.2 mmol/L — ABNORMAL HIGH (ref 20.0–28.0)
O2 Saturation: 90.7 %
Patient temperature: 36.6
pCO2 arterial: 56 mmHg — ABNORMAL HIGH (ref 32–48)
pH, Arterial: 7.37 (ref 7.35–7.45)
pO2, Arterial: 60 mmHg — ABNORMAL LOW (ref 83–108)

## 2024-10-10 ENCOUNTER — Institutional Professional Consult (permissible substitution) (HOSPITAL_COMMUNITY)

## 2024-10-10 DIAGNOSIS — Z930 Tracheostomy status: Secondary | ICD-10-CM | POA: Diagnosis not present

## 2024-10-10 DIAGNOSIS — I482 Chronic atrial fibrillation, unspecified: Secondary | ICD-10-CM | POA: Diagnosis not present

## 2024-10-10 DIAGNOSIS — J9621 Acute and chronic respiratory failure with hypoxia: Secondary | ICD-10-CM | POA: Diagnosis not present

## 2024-10-10 DIAGNOSIS — G1221 Amyotrophic lateral sclerosis: Secondary | ICD-10-CM | POA: Diagnosis not present

## 2024-10-10 LAB — CBC WITH DIFFERENTIAL/PLATELET
Abs Immature Granulocytes: 0.01 K/uL (ref 0.00–0.07)
Basophils Absolute: 0 K/uL (ref 0.0–0.1)
Basophils Relative: 1 %
Eosinophils Absolute: 0.1 K/uL (ref 0.0–0.5)
Eosinophils Relative: 3 %
HCT: 31.1 % — ABNORMAL LOW (ref 36.0–46.0)
Hemoglobin: 10 g/dL — ABNORMAL LOW (ref 12.0–15.0)
Immature Granulocytes: 0 %
Lymphocytes Relative: 29 %
Lymphs Abs: 0.9 K/uL (ref 0.7–4.0)
MCH: 27.6 pg (ref 26.0–34.0)
MCHC: 32.2 g/dL (ref 30.0–36.0)
MCV: 85.9 fL (ref 80.0–100.0)
Monocytes Absolute: 0.4 K/uL (ref 0.1–1.0)
Monocytes Relative: 13 %
Neutro Abs: 1.8 K/uL (ref 1.7–7.7)
Neutrophils Relative %: 54 %
Platelets: 328 K/uL (ref 150–400)
RBC: 3.62 MIL/uL — ABNORMAL LOW (ref 3.87–5.11)
RDW: 15.9 % — ABNORMAL HIGH (ref 11.5–15.5)
WBC: 3.2 K/uL — ABNORMAL LOW (ref 4.0–10.5)
nRBC: 0 % (ref 0.0–0.2)

## 2024-10-10 LAB — COMPREHENSIVE METABOLIC PANEL WITH GFR
ALT: 25 U/L (ref 0–44)
AST: 19 U/L (ref 15–41)
Albumin: 2.9 g/dL — ABNORMAL LOW (ref 3.5–5.0)
Alkaline Phosphatase: 71 U/L (ref 38–126)
Anion gap: 10 (ref 5–15)
BUN: 17 mg/dL (ref 8–23)
CO2: 28 mmol/L (ref 22–32)
Calcium: 9.1 mg/dL (ref 8.9–10.3)
Chloride: 101 mmol/L (ref 98–111)
Creatinine, Ser: 0.43 mg/dL — ABNORMAL LOW (ref 0.44–1.00)
GFR, Estimated: >60 mL/min (ref >60)
Glucose, Bld: 86 mg/dL (ref 70–99)
Potassium: 3.8 mmol/L (ref 3.5–5.1)
Sodium: 139 mmol/L (ref 135–145)
Total Bilirubin: 0.5 mg/dL (ref 0.0–1.2)
Total Protein: 6.3 g/dL — ABNORMAL LOW (ref 6.5–8.1)

## 2024-10-10 LAB — CLOSTRIDIUM DIFFICILE BY PCR, REFLEXED
Hypervirulent Strain: NEGATIVE
Toxigenic C. Difficile by PCR: POSITIVE — AB

## 2024-10-10 LAB — C DIFFICILE QUICK SCREEN W PCR REFLEX
C Diff antigen: POSITIVE — AB
C Diff toxin: NEGATIVE

## 2024-10-10 MED ORDER — DIATRIZOATE MEGLUMINE & SODIUM 66-10 % PO SOLN: 30.0000 mL | Freq: Once | ORAL | Status: DC

## 2024-10-10 MED ORDER — DIATRIZOATE MEGLUMINE & SODIUM 66-10 % PO SOLN
30.0000 mL | Freq: Once | ORAL | Status: AC
  Administered 2024-10-10: 30 mL

## 2024-10-11 DIAGNOSIS — Z930 Tracheostomy status: Secondary | ICD-10-CM | POA: Diagnosis not present

## 2024-10-11 DIAGNOSIS — I482 Chronic atrial fibrillation, unspecified: Secondary | ICD-10-CM | POA: Diagnosis not present

## 2024-10-11 DIAGNOSIS — G1221 Amyotrophic lateral sclerosis: Secondary | ICD-10-CM | POA: Diagnosis not present

## 2024-10-11 DIAGNOSIS — J9621 Acute and chronic respiratory failure with hypoxia: Secondary | ICD-10-CM | POA: Diagnosis not present

## 2024-10-11 LAB — COMPREHENSIVE METABOLIC PANEL WITH GFR
ALT: 22 U/L (ref 0–44)
AST: 18 U/L (ref 15–41)
Albumin: 2.7 g/dL — ABNORMAL LOW (ref 3.5–5.0)
Alkaline Phosphatase: 80 U/L (ref 38–126)
Anion gap: 9 (ref 5–15)
BUN: 16 mg/dL (ref 8–23)
CO2: 29 mmol/L (ref 22–32)
Calcium: 8.9 mg/dL (ref 8.9–10.3)
Chloride: 100 mmol/L (ref 98–111)
Creatinine, Ser: 0.41 mg/dL — ABNORMAL LOW (ref 0.44–1.00)
GFR, Estimated: >60 mL/min (ref >60)
Glucose, Bld: 138 mg/dL — ABNORMAL HIGH (ref 70–99)
Potassium: 3.9 mmol/L (ref 3.5–5.1)
Sodium: 138 mmol/L (ref 135–145)
Total Bilirubin: 0.3 mg/dL (ref 0.0–1.2)
Total Protein: 6.2 g/dL — ABNORMAL LOW (ref 6.5–8.1)

## 2024-10-11 LAB — CBC WITH DIFFERENTIAL/PLATELET
Abs Immature Granulocytes: 0.01 K/uL (ref 0.00–0.07)
Basophils Absolute: 0 K/uL (ref 0.0–0.1)
Basophils Relative: 1 %
Eosinophils Absolute: 0.1 K/uL (ref 0.0–0.5)
Eosinophils Relative: 1 %
HCT: 34.5 % — ABNORMAL LOW (ref 36.0–46.0)
Hemoglobin: 10.9 g/dL — ABNORMAL LOW (ref 12.0–15.0)
Immature Granulocytes: 0 %
Lymphocytes Relative: 18 %
Lymphs Abs: 0.7 K/uL (ref 0.7–4.0)
MCH: 27.8 pg (ref 26.0–34.0)
MCHC: 31.6 g/dL (ref 30.0–36.0)
MCV: 88 fL (ref 80.0–100.0)
Monocytes Absolute: 0.5 K/uL (ref 0.1–1.0)
Monocytes Relative: 12 %
Neutro Abs: 2.6 K/uL (ref 1.7–7.7)
Neutrophils Relative %: 68 %
Platelets: 340 K/uL (ref 150–400)
RBC: 3.92 MIL/uL (ref 3.87–5.11)
RDW: 15.5 % (ref 11.5–15.5)
WBC: 3.9 K/uL — ABNORMAL LOW (ref 4.0–10.5)
nRBC: 0 % (ref 0.0–0.2)

## 2024-10-13 DIAGNOSIS — Z930 Tracheostomy status: Secondary | ICD-10-CM | POA: Diagnosis not present

## 2024-10-13 DIAGNOSIS — I482 Chronic atrial fibrillation, unspecified: Secondary | ICD-10-CM | POA: Diagnosis not present

## 2024-10-13 DIAGNOSIS — J9621 Acute and chronic respiratory failure with hypoxia: Secondary | ICD-10-CM | POA: Diagnosis not present

## 2024-10-13 DIAGNOSIS — G1221 Amyotrophic lateral sclerosis: Secondary | ICD-10-CM | POA: Diagnosis not present

## 2024-10-14 DIAGNOSIS — I482 Chronic atrial fibrillation, unspecified: Secondary | ICD-10-CM | POA: Diagnosis not present

## 2024-10-14 DIAGNOSIS — J9621 Acute and chronic respiratory failure with hypoxia: Secondary | ICD-10-CM | POA: Diagnosis not present

## 2024-10-14 DIAGNOSIS — G1221 Amyotrophic lateral sclerosis: Secondary | ICD-10-CM | POA: Diagnosis not present

## 2024-10-14 DIAGNOSIS — Z930 Tracheostomy status: Secondary | ICD-10-CM | POA: Diagnosis not present

## 2024-10-15 DIAGNOSIS — G1221 Amyotrophic lateral sclerosis: Secondary | ICD-10-CM | POA: Diagnosis not present

## 2024-10-15 DIAGNOSIS — J9621 Acute and chronic respiratory failure with hypoxia: Secondary | ICD-10-CM | POA: Diagnosis not present

## 2024-10-15 DIAGNOSIS — I482 Chronic atrial fibrillation, unspecified: Secondary | ICD-10-CM | POA: Diagnosis not present

## 2024-10-15 DIAGNOSIS — Z930 Tracheostomy status: Secondary | ICD-10-CM | POA: Diagnosis not present

## 2024-10-15 LAB — CBC
HCT: 33.4 % — ABNORMAL LOW (ref 36.0–46.0)
Hemoglobin: 10.4 g/dL — ABNORMAL LOW (ref 12.0–15.0)
MCH: 27.4 pg (ref 26.0–34.0)
MCHC: 31.1 g/dL (ref 30.0–36.0)
MCV: 87.9 fL (ref 80.0–100.0)
Platelets: 274 K/uL (ref 150–400)
RBC: 3.8 MIL/uL — ABNORMAL LOW (ref 3.87–5.11)
RDW: 15.2 % (ref 11.5–15.5)
WBC: 3 K/uL — ABNORMAL LOW (ref 4.0–10.5)
nRBC: 0 % (ref 0.0–0.2)

## 2024-10-15 LAB — BASIC METABOLIC PANEL WITH GFR
Anion gap: 8 (ref 5–15)
BUN: 23 mg/dL (ref 8–23)
CO2: 32 mmol/L (ref 22–32)
Calcium: 9.2 mg/dL (ref 8.9–10.3)
Chloride: 98 mmol/L (ref 98–111)
Creatinine, Ser: 0.38 mg/dL — ABNORMAL LOW (ref 0.44–1.00)
GFR, Estimated: >60 mL/min (ref >60)
Glucose, Bld: 98 mg/dL (ref 70–99)
Potassium: 4.3 mmol/L (ref 3.5–5.1)
Sodium: 138 mmol/L (ref 135–145)

## 2024-10-16 DIAGNOSIS — Z930 Tracheostomy status: Secondary | ICD-10-CM | POA: Diagnosis not present

## 2024-10-16 DIAGNOSIS — G1221 Amyotrophic lateral sclerosis: Secondary | ICD-10-CM | POA: Diagnosis not present

## 2024-10-16 DIAGNOSIS — I482 Chronic atrial fibrillation, unspecified: Secondary | ICD-10-CM | POA: Diagnosis not present

## 2024-10-16 DIAGNOSIS — J9621 Acute and chronic respiratory failure with hypoxia: Secondary | ICD-10-CM | POA: Diagnosis not present

## 2024-10-17 DIAGNOSIS — Z930 Tracheostomy status: Secondary | ICD-10-CM | POA: Diagnosis not present

## 2024-10-17 DIAGNOSIS — I482 Chronic atrial fibrillation, unspecified: Secondary | ICD-10-CM | POA: Diagnosis not present

## 2024-10-17 DIAGNOSIS — G1221 Amyotrophic lateral sclerosis: Secondary | ICD-10-CM | POA: Diagnosis not present

## 2024-10-17 DIAGNOSIS — J9621 Acute and chronic respiratory failure with hypoxia: Secondary | ICD-10-CM | POA: Diagnosis not present

## 2024-10-18 DIAGNOSIS — J9621 Acute and chronic respiratory failure with hypoxia: Secondary | ICD-10-CM | POA: Diagnosis not present

## 2024-10-18 DIAGNOSIS — Z930 Tracheostomy status: Secondary | ICD-10-CM | POA: Diagnosis not present

## 2024-10-18 DIAGNOSIS — G1221 Amyotrophic lateral sclerosis: Secondary | ICD-10-CM | POA: Diagnosis not present

## 2024-10-18 DIAGNOSIS — I482 Chronic atrial fibrillation, unspecified: Secondary | ICD-10-CM | POA: Diagnosis not present

## 2024-10-20 DIAGNOSIS — J9621 Acute and chronic respiratory failure with hypoxia: Secondary | ICD-10-CM | POA: Diagnosis not present

## 2024-10-20 DIAGNOSIS — Z930 Tracheostomy status: Secondary | ICD-10-CM | POA: Diagnosis not present

## 2024-10-20 DIAGNOSIS — I482 Chronic atrial fibrillation, unspecified: Secondary | ICD-10-CM | POA: Diagnosis not present

## 2024-10-20 DIAGNOSIS — G1221 Amyotrophic lateral sclerosis: Secondary | ICD-10-CM | POA: Diagnosis not present

## 2024-10-21 DIAGNOSIS — G1221 Amyotrophic lateral sclerosis: Secondary | ICD-10-CM | POA: Diagnosis not present

## 2024-10-21 DIAGNOSIS — Z930 Tracheostomy status: Secondary | ICD-10-CM | POA: Diagnosis not present

## 2024-10-21 DIAGNOSIS — I482 Chronic atrial fibrillation, unspecified: Secondary | ICD-10-CM | POA: Diagnosis not present

## 2024-10-21 DIAGNOSIS — J9621 Acute and chronic respiratory failure with hypoxia: Secondary | ICD-10-CM | POA: Diagnosis not present

## 2024-10-22 DIAGNOSIS — J9621 Acute and chronic respiratory failure with hypoxia: Secondary | ICD-10-CM | POA: Diagnosis not present

## 2024-10-22 DIAGNOSIS — I482 Chronic atrial fibrillation, unspecified: Secondary | ICD-10-CM | POA: Diagnosis not present

## 2024-10-22 DIAGNOSIS — Z930 Tracheostomy status: Secondary | ICD-10-CM | POA: Diagnosis not present

## 2024-10-22 DIAGNOSIS — G1221 Amyotrophic lateral sclerosis: Secondary | ICD-10-CM | POA: Diagnosis not present

## 2024-10-22 LAB — BASIC METABOLIC PANEL WITH GFR
Anion gap: 8 (ref 5–15)
BUN: 26 mg/dL — ABNORMAL HIGH (ref 8–23)
CO2: 34 mmol/L — ABNORMAL HIGH (ref 22–32)
Calcium: 9.5 mg/dL (ref 8.9–10.3)
Chloride: 97 mmol/L — ABNORMAL LOW (ref 98–111)
Creatinine, Ser: 0.42 mg/dL — ABNORMAL LOW (ref 0.44–1.00)
GFR, Estimated: >60 mL/min (ref >60)
Glucose, Bld: 98 mg/dL (ref 70–99)
Potassium: 4.4 mmol/L (ref 3.5–5.1)
Sodium: 139 mmol/L (ref 135–145)

## 2024-10-22 LAB — CBC
HCT: 35.4 % — ABNORMAL LOW (ref 36.0–46.0)
Hemoglobin: 11.2 g/dL — ABNORMAL LOW (ref 12.0–15.0)
MCH: 27.7 pg (ref 26.0–34.0)
MCHC: 31.6 g/dL (ref 30.0–36.0)
MCV: 87.4 fL (ref 80.0–100.0)
Platelets: 243 K/uL (ref 150–400)
RBC: 4.05 MIL/uL (ref 3.87–5.11)
RDW: 14.9 % (ref 11.5–15.5)
WBC: 4.3 K/uL (ref 4.0–10.5)
nRBC: 0 % (ref 0.0–0.2)

## 2024-10-22 LAB — MAGNESIUM: Magnesium: 1.9 mg/dL (ref 1.7–2.4)

## 2024-10-23 ENCOUNTER — Institutional Professional Consult (permissible substitution) (HOSPITAL_COMMUNITY)

## 2024-10-23 DIAGNOSIS — Z930 Tracheostomy status: Secondary | ICD-10-CM | POA: Diagnosis not present

## 2024-10-23 DIAGNOSIS — J9621 Acute and chronic respiratory failure with hypoxia: Secondary | ICD-10-CM | POA: Diagnosis not present

## 2024-10-23 DIAGNOSIS — G1221 Amyotrophic lateral sclerosis: Secondary | ICD-10-CM | POA: Diagnosis not present

## 2024-10-23 DIAGNOSIS — I482 Chronic atrial fibrillation, unspecified: Secondary | ICD-10-CM | POA: Diagnosis not present

## 2024-10-24 DIAGNOSIS — G1221 Amyotrophic lateral sclerosis: Secondary | ICD-10-CM | POA: Diagnosis not present

## 2024-10-24 DIAGNOSIS — J9621 Acute and chronic respiratory failure with hypoxia: Secondary | ICD-10-CM | POA: Diagnosis not present

## 2024-10-24 DIAGNOSIS — I482 Chronic atrial fibrillation, unspecified: Secondary | ICD-10-CM | POA: Diagnosis not present

## 2024-10-24 DIAGNOSIS — Z930 Tracheostomy status: Secondary | ICD-10-CM | POA: Diagnosis not present

## 2024-10-25 DIAGNOSIS — Z930 Tracheostomy status: Secondary | ICD-10-CM | POA: Diagnosis not present

## 2024-10-25 DIAGNOSIS — I482 Chronic atrial fibrillation, unspecified: Secondary | ICD-10-CM | POA: Diagnosis not present

## 2024-10-25 DIAGNOSIS — G1221 Amyotrophic lateral sclerosis: Secondary | ICD-10-CM | POA: Diagnosis not present

## 2024-10-25 DIAGNOSIS — J9621 Acute and chronic respiratory failure with hypoxia: Secondary | ICD-10-CM | POA: Diagnosis not present

## 2024-10-25 LAB — CULTURE, RESPIRATORY W GRAM STAIN

## 2024-10-26 DIAGNOSIS — J9621 Acute and chronic respiratory failure with hypoxia: Secondary | ICD-10-CM | POA: Diagnosis not present

## 2024-10-26 DIAGNOSIS — I482 Chronic atrial fibrillation, unspecified: Secondary | ICD-10-CM | POA: Diagnosis not present

## 2024-10-26 DIAGNOSIS — G1221 Amyotrophic lateral sclerosis: Secondary | ICD-10-CM | POA: Diagnosis not present

## 2024-10-26 DIAGNOSIS — Z930 Tracheostomy status: Secondary | ICD-10-CM | POA: Diagnosis not present

## 2024-10-27 DIAGNOSIS — I482 Chronic atrial fibrillation, unspecified: Secondary | ICD-10-CM | POA: Diagnosis not present

## 2024-10-27 DIAGNOSIS — G1221 Amyotrophic lateral sclerosis: Secondary | ICD-10-CM | POA: Diagnosis not present

## 2024-10-27 DIAGNOSIS — Z930 Tracheostomy status: Secondary | ICD-10-CM | POA: Diagnosis not present

## 2024-10-27 DIAGNOSIS — J9621 Acute and chronic respiratory failure with hypoxia: Secondary | ICD-10-CM | POA: Diagnosis not present

## 2024-10-28 DIAGNOSIS — I482 Chronic atrial fibrillation, unspecified: Secondary | ICD-10-CM | POA: Diagnosis not present

## 2024-10-28 DIAGNOSIS — J9621 Acute and chronic respiratory failure with hypoxia: Secondary | ICD-10-CM | POA: Diagnosis not present

## 2024-10-28 DIAGNOSIS — Z930 Tracheostomy status: Secondary | ICD-10-CM | POA: Diagnosis not present

## 2024-10-28 DIAGNOSIS — G1221 Amyotrophic lateral sclerosis: Secondary | ICD-10-CM | POA: Diagnosis not present

## 2024-10-29 DIAGNOSIS — J9621 Acute and chronic respiratory failure with hypoxia: Secondary | ICD-10-CM | POA: Diagnosis not present

## 2024-10-29 DIAGNOSIS — I482 Chronic atrial fibrillation, unspecified: Secondary | ICD-10-CM | POA: Diagnosis not present

## 2024-10-29 DIAGNOSIS — G1221 Amyotrophic lateral sclerosis: Secondary | ICD-10-CM | POA: Diagnosis not present

## 2024-10-29 DIAGNOSIS — Z930 Tracheostomy status: Secondary | ICD-10-CM | POA: Diagnosis not present

## 2024-10-30 DIAGNOSIS — J9621 Acute and chronic respiratory failure with hypoxia: Secondary | ICD-10-CM | POA: Diagnosis not present

## 2024-10-30 DIAGNOSIS — G1221 Amyotrophic lateral sclerosis: Secondary | ICD-10-CM | POA: Diagnosis not present

## 2024-10-30 DIAGNOSIS — I482 Chronic atrial fibrillation, unspecified: Secondary | ICD-10-CM | POA: Diagnosis not present

## 2024-10-30 DIAGNOSIS — Z930 Tracheostomy status: Secondary | ICD-10-CM | POA: Diagnosis not present

## 2024-10-31 DIAGNOSIS — G1221 Amyotrophic lateral sclerosis: Secondary | ICD-10-CM | POA: Diagnosis not present

## 2024-10-31 DIAGNOSIS — J9621 Acute and chronic respiratory failure with hypoxia: Secondary | ICD-10-CM | POA: Diagnosis not present

## 2024-10-31 DIAGNOSIS — Z930 Tracheostomy status: Secondary | ICD-10-CM | POA: Diagnosis not present

## 2024-10-31 DIAGNOSIS — I482 Chronic atrial fibrillation, unspecified: Secondary | ICD-10-CM | POA: Diagnosis not present

## 2024-11-01 DIAGNOSIS — G1221 Amyotrophic lateral sclerosis: Secondary | ICD-10-CM | POA: Diagnosis not present

## 2024-11-01 DIAGNOSIS — J9621 Acute and chronic respiratory failure with hypoxia: Secondary | ICD-10-CM | POA: Diagnosis not present

## 2024-11-01 DIAGNOSIS — Z930 Tracheostomy status: Secondary | ICD-10-CM | POA: Diagnosis not present

## 2024-11-01 DIAGNOSIS — I482 Chronic atrial fibrillation, unspecified: Secondary | ICD-10-CM | POA: Diagnosis not present

## 2024-11-02 ENCOUNTER — Institutional Professional Consult (permissible substitution) (HOSPITAL_COMMUNITY)

## 2024-11-02 DIAGNOSIS — Z930 Tracheostomy status: Secondary | ICD-10-CM | POA: Diagnosis not present

## 2024-11-02 DIAGNOSIS — J9621 Acute and chronic respiratory failure with hypoxia: Secondary | ICD-10-CM | POA: Diagnosis not present

## 2024-11-02 DIAGNOSIS — I482 Chronic atrial fibrillation, unspecified: Secondary | ICD-10-CM | POA: Diagnosis not present

## 2024-11-02 DIAGNOSIS — G1221 Amyotrophic lateral sclerosis: Secondary | ICD-10-CM | POA: Diagnosis not present

## 2024-11-02 LAB — CBC
HCT: 33.3 % — ABNORMAL LOW (ref 36.0–46.0)
Hemoglobin: 10.4 g/dL — ABNORMAL LOW (ref 12.0–15.0)
MCH: 27.2 pg (ref 26.0–34.0)
MCHC: 31.2 g/dL (ref 30.0–36.0)
MCV: 86.9 fL (ref 80.0–100.0)
Platelets: 230 K/uL (ref 150–400)
RBC: 3.83 MIL/uL — ABNORMAL LOW (ref 3.87–5.11)
RDW: 14.9 % (ref 11.5–15.5)
WBC: 4 K/uL (ref 4.0–10.5)
nRBC: 0 % (ref 0.0–0.2)

## 2024-11-02 LAB — COMPREHENSIVE METABOLIC PANEL WITH GFR
ALT: 27 U/L (ref 0–44)
AST: 22 U/L (ref 15–41)
Albumin: 2.7 g/dL — ABNORMAL LOW (ref 3.5–5.0)
Alkaline Phosphatase: 95 U/L (ref 38–126)
Anion gap: 7 (ref 5–15)
BUN: 24 mg/dL — ABNORMAL HIGH (ref 8–23)
CO2: 34 mmol/L — ABNORMAL HIGH (ref 22–32)
Calcium: 9.1 mg/dL (ref 8.9–10.3)
Chloride: 95 mmol/L — ABNORMAL LOW (ref 98–111)
Creatinine, Ser: 0.45 mg/dL (ref 0.44–1.00)
GFR, Estimated: >60 mL/min (ref >60)
Glucose, Bld: 118 mg/dL — ABNORMAL HIGH (ref 70–99)
Potassium: 4.5 mmol/L (ref 3.5–5.1)
Sodium: 136 mmol/L (ref 135–145)
Total Bilirubin: 0.4 mg/dL (ref 0.0–1.2)
Total Protein: 6.2 g/dL — ABNORMAL LOW (ref 6.5–8.1)

## 2024-11-02 LAB — MAGNESIUM: Magnesium: 1.8 mg/dL (ref 1.7–2.4)

## 2024-11-02 NOTE — Progress Notes (Signed)
 PATIENT INFORMATION   Patient Name: Dominique Brewer  Date of Birth: [birth year]  MRN: [id number]   Date of Admission: 10/09/2024  3:27 PM Length of Stay: 24 Length of Stay: 24   Primary Care Physician: Wanda Formosa, MD  Attending Physician:[email or web address], ROGUE SAILOR, NP    SUBJECTIVE   Chief Complaint:  Chronic hypoxic and hypercapnic respiratory failure   Interval Summary:  Dominique Brewer is a 78 y.o. female that has been admitted [facility] was [facility] there on 09/27/2024 due to tachycardia with chest tightness and dyspnea.  Dr. 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  She was normally using trach collar during the day and vent support at night.  Apparently her trach became disconnected from the ventilator tubing and she was unable to reconnect it and shortly thereafter became short of breath with tachycardia to the 180s the headache that prompted a call to 911.  Upon EMS arrival, the patient was found to be hypoxic to the 50s where she was placed on 8 L of oxygen on her home vent and maintain sats of 100%.  Apparently the patient lives at home alone and they reported she was too weak to stand since returning home.  Her sister lives 10 minutes away but could no longer help her around the clock.  It was reported she had not taken any of her home medications are had G-tube feeds since her discharge.  She had a prolonged hospital course and attempt to figure out safe disposition as she was too weak to live alone and manage her tube feeds and vent by herself.  Her DME company is not able to offer services if she does not have a 24/7 caregiver.  They continued her on the ventilator until  her blood gases normalized.  She is now on her baseline vent settings and after further discussion was decided for her to come to [facility] for further care.    Subjective:  Patient is complaining of too many fingersticks for sugar check.   OBJECTIVE   Objective  Vital Signs:  BP (!) 160/80   Pulse 92   Temp 97 F (36.1 C) (Axillary)   Resp 20   Ht 5' 4 (1.626 m)   Wt 96 lb 3.2 oz (43.6 kg)   SpO2 95%   BMI 16.51 kg/m    I/O last 24 Hours:  Intake/Output Summary (Last 24 hours) at 11/02/2024 0932 Last data filed at 11/02/2024 0740 Gross per 24 hour  Intake 987 ml  Output 400 ml  Net 587 ml      FiO2 (%):  [28 %] 28 % S RR:  [14] 14 S VT:  [380 mL or cc] 380 mL or cc PEEP (cmH2O):  [5 cmH2O] 5 cmH2O MAP (cm H2O):  [5.8-6.5] 6.5    Physical Exam General: Frail, thin, debilitated female sitting up in chair, appears in no acute distress.  HEENT:  Normocephalic atraumatic, PERRLA, EOMs intact, nares  patent, oropharynx is clear  Neck : Supple, tracheostomy  midline. PMV off. ATC 28% with 02 sats 100%. Very diminished breath sounds throughout all lung fields.    Cardiovascular : S1-S2 WNL, no  murmurs rubs or gallops Lungs:  Rhonchi and  diminished breath sounds throughout all lung fields.  Abdomen: + BS X4 Quads, soft, peg intact Extremities: no clubbing, no cyanosis or edema  Integumentary: Warm & dry. Normal color for ethnicity.  Neuro:  awake, alert and oriented   LABS:   Latest Reference Range & Units 11/02/24 06:49  Comprehensive metabolic panel with GFR  Rpt !  Sodium 135 - 145 mmol/L 136  Potassium 3.5 - 5.1 mmol/L 4.5  Chloride 98 - 111 mmol/L 95 (L)  CO2 22 - 32 mmol/L 34 (H)  Glucose 70 - 99 mg/dL 881 (H)  BUN 8 - 23 mg/dL 24 (H)  Creatinine 9.55 - 1.00 mg/dL 9.54  Calcium 8.9 - 89.6 mg/dL 9.1  Anion gap 5 - 15  7  Magnesium  1.7 - 2.4 mg/dL 1.8  Alkaline Phosphatase 38 - 126 U/L 95  Albumin  3.5 - 5.0 g/dL 2.7 (L)  AST 15 - 41 U/L 22  ALT 0  - 44 U/L 27  Total Protein 6.5 - 8.1 g/dL 6.2 (L)  Total Bilirubin 0.0 - 1.2 mg/dL 0.4  GFR, Estimated >39 mL/min >60  WBC 4.0 - 10.5 K/uL 4.0  RBC 3.87 - 5.11 MIL/uL 3.83 (L)  Hemoglobin 12.0 - 15.0 g/dL 89.5 (L)  HCT 63.9 - 53.9 % 33.3 (L)  MCV 80.0 - 100.0 fL 86.9  MCH 26.0 - 34.0 pg 27.2  MCHC 30.0 - 36.0 g/dL 68.7  RDW 88.4 - 84.4 % 14.9  Platelets 150 - 400 K/uL 230  nRBC 0.0 - 0.2 % 0.0  !: Data is abnormal (L): Data is abnormally low (H): Data is abnormally high Rpt: View report in Results Review for more information    IMAGING:   CXR 10/24/2024 No acute cardiopulmonary process. Stable tracheostomy tube position.     ASSESSMENT AND PLAN   Acute on chronic hypoxic and hypercapnic respiratory failure.  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  Developed acute on chronic respiratory failure prompting readmission.  Currently back to baseline.   Pulmonary Medicine will continue to follow.   Continues with ATC day with PMV as tolerated & vent @ HS.   Pseudomonas hospital-acquired pneumonia.10/23/2024 CXR with no acute process, Sputum Cx + pansensitive Pseudomonas Aeruginosa started on tobramycin nebs due to increased tracheal secretions.  Will complete her tobramycin nebs on 11/02/2024.    ALS.  This was diagnosed at the outside hospital and she was actually doing well @ a skilled facility where she was up walking with a rolling walker before her discharge home.   Has PEG and trach in place. Weak cough, diminished breath sounds throughout, decreased exercise tolerance.  Patient to follow up with Neurology upon discharge.   C diff colitis, treated.  She has been having diarrhea and PCR C diff testing was positive for antigen, negative for toxin.  She completed a course of oral vancomycin  on 10/21/2024.  Diarrhea has resolved.   SVT/history of AFib.  Patient with history of AFib and SVT. Currently SR on tele,  rate controlled on diltiazem .  S/p Cardiology consult on 10/30/2024.  Recommended to continue with diltiazem .  May use low-dose beta-blocker as needed.  Cardiology ordered echocardiogram.  Continue telemetry.    Dysphagia.  Patient continues with easy to chew diet by ST @ OSH, She is underweight, BMI 16.6  po. Intake poor due to fatigue and slow mastication.  Patient has been  switched from Nocturnal to continuous feeds due to poor p.o. intake.  Discussed with patient that we have to continue monitoring FSBS because she is on tube feed Patient is followed by dietitian and speech therapist.  Continue to encourage increased p.o. intake.      Carotid stenosis. No significant carotid stenosis on CTA head and neck 03/13/24. Patient has refused statin.   Wounds.  She has a  DTI to her sacrum where wound care we will continue to follow.  We will optimize nutrition for wound care healing.   Hypertension.  She remains on diltiazem , Will continue to monitor   Debility.   PT, OT, and speech therapy are involved.    Prevention.  She is currently on Lovenox  for DVT prophylaxis and Pepcid  for GI prophylaxis.   Dispo.  Case management is involved with her discharge planning.     Code Status:  Full Resuscitation    Spent a total of 51 minutes on this encounter. This includes reviewing patient's extensive history, assessment and visit with the patient as well as documentation time. Time spent also includes IDT collaboration.    SIGNATURE   Electronically signed:  RAINA ROGUE SAILOR, NP  11/02/2024 at 9:32 AM EST

## 2024-11-03 DIAGNOSIS — G1221 Amyotrophic lateral sclerosis: Secondary | ICD-10-CM | POA: Diagnosis not present

## 2024-11-03 DIAGNOSIS — Z930 Tracheostomy status: Secondary | ICD-10-CM | POA: Diagnosis not present

## 2024-11-03 DIAGNOSIS — J9621 Acute and chronic respiratory failure with hypoxia: Secondary | ICD-10-CM | POA: Diagnosis not present

## 2024-11-03 DIAGNOSIS — I482 Chronic atrial fibrillation, unspecified: Secondary | ICD-10-CM | POA: Diagnosis not present

## 2024-11-04 DIAGNOSIS — G1221 Amyotrophic lateral sclerosis: Secondary | ICD-10-CM | POA: Diagnosis not present

## 2024-11-04 DIAGNOSIS — J9621 Acute and chronic respiratory failure with hypoxia: Secondary | ICD-10-CM | POA: Diagnosis not present

## 2024-11-04 DIAGNOSIS — Z930 Tracheostomy status: Secondary | ICD-10-CM | POA: Diagnosis not present

## 2024-11-04 DIAGNOSIS — I482 Chronic atrial fibrillation, unspecified: Secondary | ICD-10-CM | POA: Diagnosis not present

## 2024-11-05 DIAGNOSIS — G1221 Amyotrophic lateral sclerosis: Secondary | ICD-10-CM | POA: Diagnosis not present

## 2024-11-05 DIAGNOSIS — I482 Chronic atrial fibrillation, unspecified: Secondary | ICD-10-CM | POA: Diagnosis not present

## 2024-11-05 DIAGNOSIS — J9621 Acute and chronic respiratory failure with hypoxia: Secondary | ICD-10-CM | POA: Diagnosis not present

## 2024-11-05 DIAGNOSIS — Z930 Tracheostomy status: Secondary | ICD-10-CM | POA: Diagnosis not present

## 2024-11-05 LAB — URINALYSIS, ROUTINE W REFLEX MICROSCOPIC
Bacteria, UA: NONE SEEN
Bilirubin Urine: NEGATIVE
Glucose, UA: NEGATIVE mg/dL
Hgb urine dipstick: NEGATIVE
Ketones, ur: NEGATIVE mg/dL
Nitrite: NEGATIVE
Protein, ur: NEGATIVE mg/dL
Specific Gravity, Urine: 1.017 (ref 1.005–1.030)
pH: 6 (ref 5.0–8.0)

## 2024-11-06 DIAGNOSIS — I482 Chronic atrial fibrillation, unspecified: Secondary | ICD-10-CM | POA: Diagnosis not present

## 2024-11-06 DIAGNOSIS — Z930 Tracheostomy status: Secondary | ICD-10-CM | POA: Diagnosis not present

## 2024-11-06 DIAGNOSIS — G1221 Amyotrophic lateral sclerosis: Secondary | ICD-10-CM | POA: Diagnosis not present

## 2024-11-06 DIAGNOSIS — J9621 Acute and chronic respiratory failure with hypoxia: Secondary | ICD-10-CM | POA: Diagnosis not present

## 2024-11-07 DIAGNOSIS — G1221 Amyotrophic lateral sclerosis: Secondary | ICD-10-CM | POA: Diagnosis not present

## 2024-11-07 DIAGNOSIS — Z930 Tracheostomy status: Secondary | ICD-10-CM | POA: Diagnosis not present

## 2024-11-07 DIAGNOSIS — J9621 Acute and chronic respiratory failure with hypoxia: Secondary | ICD-10-CM | POA: Diagnosis not present

## 2024-11-07 DIAGNOSIS — I482 Chronic atrial fibrillation, unspecified: Secondary | ICD-10-CM | POA: Diagnosis not present

## 2024-11-07 LAB — CBC WITH DIFFERENTIAL/PLATELET
Abs Immature Granulocytes: 0.01 K/uL (ref 0.00–0.07)
Basophils Absolute: 0 K/uL (ref 0.0–0.1)
Basophils Relative: 0 %
Eosinophils Absolute: 0.1 K/uL (ref 0.0–0.5)
Eosinophils Relative: 1 %
HCT: 37.3 % (ref 36.0–46.0)
Hemoglobin: 11.7 g/dL — ABNORMAL LOW (ref 12.0–15.0)
Immature Granulocytes: 0 %
Lymphocytes Relative: 13 %
Lymphs Abs: 0.6 K/uL — ABNORMAL LOW (ref 0.7–4.0)
MCH: 27.3 pg (ref 26.0–34.0)
MCHC: 31.4 g/dL (ref 30.0–36.0)
MCV: 87.1 fL (ref 80.0–100.0)
Monocytes Absolute: 0.7 K/uL (ref 0.1–1.0)
Monocytes Relative: 15 %
Neutro Abs: 3.2 K/uL (ref 1.7–7.7)
Neutrophils Relative %: 71 %
Platelets: 307 K/uL (ref 150–400)
RBC: 4.28 MIL/uL (ref 3.87–5.11)
RDW: 15.1 % (ref 11.5–15.5)
WBC: 4.6 K/uL (ref 4.0–10.5)
nRBC: 0 % (ref 0.0–0.2)

## 2024-11-08 LAB — BASIC METABOLIC PANEL WITH GFR
Anion gap: 14 (ref 5–15)
BUN: 21 mg/dL (ref 8–23)
CO2: 29 mmol/L (ref 22–32)
Calcium: 9.4 mg/dL (ref 8.9–10.3)
Chloride: 94 mmol/L — ABNORMAL LOW (ref 98–111)
Creatinine, Ser: 0.37 mg/dL — ABNORMAL LOW (ref 0.44–1.00)
GFR, Estimated: >60 mL/min (ref >60)
Glucose, Bld: 110 mg/dL — ABNORMAL HIGH (ref 70–99)
Potassium: 4.7 mmol/L (ref 3.5–5.1)
Sodium: 137 mmol/L (ref 135–145)

## 2024-11-08 LAB — URINE CULTURE: Culture: 10000 — AB

## 2024-11-08 LAB — PHOSPHORUS: Phosphorus: 3 mg/dL (ref 2.5–4.6)

## 2024-11-08 LAB — MAGNESIUM: Magnesium: 1.8 mg/dL (ref 1.7–2.4)

## 2024-11-09 ENCOUNTER — Institutional Professional Consult (permissible substitution) (HOSPITAL_COMMUNITY)

## 2024-11-09 ENCOUNTER — Other Ambulatory Visit: Payer: Self-pay

## 2024-11-09 DIAGNOSIS — I482 Chronic atrial fibrillation, unspecified: Secondary | ICD-10-CM | POA: Diagnosis not present

## 2024-11-09 DIAGNOSIS — G1221 Amyotrophic lateral sclerosis: Secondary | ICD-10-CM | POA: Diagnosis not present

## 2024-11-09 DIAGNOSIS — Z930 Tracheostomy status: Secondary | ICD-10-CM | POA: Diagnosis not present

## 2024-11-09 DIAGNOSIS — J9621 Acute and chronic respiratory failure with hypoxia: Secondary | ICD-10-CM | POA: Diagnosis not present

## 2024-11-09 LAB — CBC
HCT: 38.1 % (ref 36.0–46.0)
Hemoglobin: 12 g/dL (ref 12.0–15.0)
MCH: 27.1 pg (ref 26.0–34.0)
MCHC: 31.5 g/dL (ref 30.0–36.0)
MCV: 86 fL (ref 80.0–100.0)
Platelets: 285 K/uL (ref 150–400)
RBC: 4.43 MIL/uL (ref 3.87–5.11)
RDW: 15.3 % (ref 11.5–15.5)
WBC: 3.9 K/uL — ABNORMAL LOW (ref 4.0–10.5)
nRBC: 0 % (ref 0.0–0.2)

## 2024-11-09 LAB — COMPREHENSIVE METABOLIC PANEL WITH GFR
ALT: 59 U/L — ABNORMAL HIGH (ref 0–44)
AST: 39 U/L (ref 15–41)
Albumin: 3.3 g/dL — ABNORMAL LOW (ref 3.5–5.0)
Alkaline Phosphatase: 120 U/L (ref 38–126)
Anion gap: 15 (ref 5–15)
BUN: 23 mg/dL (ref 8–23)
CO2: 31 mmol/L (ref 22–32)
Calcium: 9.6 mg/dL (ref 8.9–10.3)
Chloride: 92 mmol/L — ABNORMAL LOW (ref 98–111)
Creatinine, Ser: 0.33 mg/dL — ABNORMAL LOW (ref 0.44–1.00)
GFR, Estimated: >60 mL/min (ref >60)
Glucose, Bld: 128 mg/dL — ABNORMAL HIGH (ref 70–99)
Potassium: 4.6 mmol/L (ref 3.5–5.1)
Sodium: 138 mmol/L (ref 135–145)
Total Bilirubin: 0.2 mg/dL (ref 0.0–1.2)
Total Protein: 7.1 g/dL (ref 6.5–8.1)

## 2024-11-09 LAB — MAGNESIUM: Magnesium: 1.8 mg/dL (ref 1.7–2.4)

## 2024-11-09 LAB — ECHOCARDIOGRAM COMPLETE
P 1/2 time: 333 ms
S' Lateral: 2.6 cm

## 2024-11-09 LAB — TROPONIN I (HIGH SENSITIVITY): Troponin I (High Sensitivity): 11 ng/L (ref <18)

## 2024-11-09 NOTE — Progress Notes (Signed)
 No concerns for airway safety at this time. ATC 28% in place. PMV used when needed by patient.

## 2024-11-09 NOTE — Progress Notes (Signed)
  Echocardiogram 2D Echocardiogram has been performed.  Dominique Brewer 11/09/2024, 4:10 PM

## 2024-11-10 DIAGNOSIS — Z930 Tracheostomy status: Secondary | ICD-10-CM | POA: Diagnosis not present

## 2024-11-10 DIAGNOSIS — J9621 Acute and chronic respiratory failure with hypoxia: Secondary | ICD-10-CM | POA: Diagnosis not present

## 2024-11-10 DIAGNOSIS — I482 Chronic atrial fibrillation, unspecified: Secondary | ICD-10-CM | POA: Diagnosis not present

## 2024-11-10 DIAGNOSIS — G1221 Amyotrophic lateral sclerosis: Secondary | ICD-10-CM | POA: Diagnosis not present

## 2024-11-10 LAB — TROPONIN I (HIGH SENSITIVITY)
Troponin I (High Sensitivity): 10 ng/L (ref <18)
Troponin I (High Sensitivity): 10 ng/L (ref <18)

## 2024-11-11 DIAGNOSIS — J9621 Acute and chronic respiratory failure with hypoxia: Secondary | ICD-10-CM | POA: Diagnosis not present

## 2024-11-11 DIAGNOSIS — Z930 Tracheostomy status: Secondary | ICD-10-CM | POA: Diagnosis not present

## 2024-11-11 DIAGNOSIS — G1221 Amyotrophic lateral sclerosis: Secondary | ICD-10-CM | POA: Diagnosis not present

## 2024-11-11 DIAGNOSIS — I482 Chronic atrial fibrillation, unspecified: Secondary | ICD-10-CM | POA: Diagnosis not present

## 2024-11-11 LAB — HEMOGLOBIN A1C
Hgb A1c MFr Bld: 5.9 % — ABNORMAL HIGH (ref 4.8–5.6)
Mean Plasma Glucose: 122.63 mg/dL

## 2024-11-12 DIAGNOSIS — I482 Chronic atrial fibrillation, unspecified: Secondary | ICD-10-CM | POA: Diagnosis not present

## 2024-11-12 DIAGNOSIS — G1221 Amyotrophic lateral sclerosis: Secondary | ICD-10-CM | POA: Diagnosis not present

## 2024-11-12 DIAGNOSIS — J9621 Acute and chronic respiratory failure with hypoxia: Secondary | ICD-10-CM | POA: Diagnosis not present

## 2024-11-12 DIAGNOSIS — Z930 Tracheostomy status: Secondary | ICD-10-CM | POA: Diagnosis not present

## 2024-11-13 DIAGNOSIS — G1221 Amyotrophic lateral sclerosis: Secondary | ICD-10-CM | POA: Diagnosis not present

## 2024-11-13 DIAGNOSIS — J9621 Acute and chronic respiratory failure with hypoxia: Secondary | ICD-10-CM | POA: Diagnosis not present

## 2024-11-13 DIAGNOSIS — Z930 Tracheostomy status: Secondary | ICD-10-CM | POA: Diagnosis not present

## 2024-11-13 DIAGNOSIS — I482 Chronic atrial fibrillation, unspecified: Secondary | ICD-10-CM | POA: Diagnosis not present

## 2024-11-14 DIAGNOSIS — J9621 Acute and chronic respiratory failure with hypoxia: Secondary | ICD-10-CM | POA: Diagnosis not present

## 2024-11-14 DIAGNOSIS — I482 Chronic atrial fibrillation, unspecified: Secondary | ICD-10-CM | POA: Diagnosis not present

## 2024-11-14 DIAGNOSIS — G1221 Amyotrophic lateral sclerosis: Secondary | ICD-10-CM | POA: Diagnosis not present

## 2024-11-14 DIAGNOSIS — Z930 Tracheostomy status: Secondary | ICD-10-CM | POA: Diagnosis not present

## 2024-11-15 DIAGNOSIS — J9621 Acute and chronic respiratory failure with hypoxia: Secondary | ICD-10-CM | POA: Diagnosis not present

## 2024-11-15 DIAGNOSIS — I482 Chronic atrial fibrillation, unspecified: Secondary | ICD-10-CM | POA: Diagnosis not present

## 2024-11-15 DIAGNOSIS — Z930 Tracheostomy status: Secondary | ICD-10-CM | POA: Diagnosis not present

## 2024-11-15 DIAGNOSIS — G1221 Amyotrophic lateral sclerosis: Secondary | ICD-10-CM | POA: Diagnosis not present

## 2024-11-16 DIAGNOSIS — J9621 Acute and chronic respiratory failure with hypoxia: Secondary | ICD-10-CM | POA: Diagnosis not present

## 2024-11-16 DIAGNOSIS — G1221 Amyotrophic lateral sclerosis: Secondary | ICD-10-CM | POA: Diagnosis not present

## 2024-11-16 DIAGNOSIS — I482 Chronic atrial fibrillation, unspecified: Secondary | ICD-10-CM | POA: Diagnosis not present

## 2024-11-16 DIAGNOSIS — Z930 Tracheostomy status: Secondary | ICD-10-CM | POA: Diagnosis not present

## 2024-11-17 DIAGNOSIS — I482 Chronic atrial fibrillation, unspecified: Secondary | ICD-10-CM | POA: Diagnosis not present

## 2024-11-17 DIAGNOSIS — Z930 Tracheostomy status: Secondary | ICD-10-CM | POA: Diagnosis not present

## 2024-11-17 DIAGNOSIS — J9621 Acute and chronic respiratory failure with hypoxia: Secondary | ICD-10-CM | POA: Diagnosis not present

## 2024-11-17 DIAGNOSIS — G1221 Amyotrophic lateral sclerosis: Secondary | ICD-10-CM | POA: Diagnosis not present

## 2024-11-18 DIAGNOSIS — J9621 Acute and chronic respiratory failure with hypoxia: Secondary | ICD-10-CM | POA: Diagnosis not present

## 2024-11-18 DIAGNOSIS — Z930 Tracheostomy status: Secondary | ICD-10-CM | POA: Diagnosis not present

## 2024-11-18 DIAGNOSIS — G1221 Amyotrophic lateral sclerosis: Secondary | ICD-10-CM | POA: Diagnosis not present

## 2024-11-18 DIAGNOSIS — I482 Chronic atrial fibrillation, unspecified: Secondary | ICD-10-CM | POA: Diagnosis not present

## 2024-11-18 LAB — BASIC METABOLIC PANEL WITH GFR
Anion gap: 9 (ref 5–15)
BUN: 24 mg/dL — ABNORMAL HIGH (ref 8–23)
CO2: 37 mmol/L — ABNORMAL HIGH (ref 22–32)
Calcium: 9.4 mg/dL (ref 8.9–10.3)
Chloride: 94 mmol/L — ABNORMAL LOW (ref 98–111)
Creatinine, Ser: 0.32 mg/dL — ABNORMAL LOW (ref 0.44–1.00)
GFR, Estimated: >60 mL/min (ref >60)
Glucose, Bld: 114 mg/dL — ABNORMAL HIGH (ref 70–99)
Potassium: 4.6 mmol/L (ref 3.5–5.1)
Sodium: 140 mmol/L (ref 135–145)

## 2024-11-19 ENCOUNTER — Institutional Professional Consult (permissible substitution) (HOSPITAL_COMMUNITY)

## 2024-11-19 DIAGNOSIS — Z930 Tracheostomy status: Secondary | ICD-10-CM | POA: Diagnosis not present

## 2024-11-19 DIAGNOSIS — G1221 Amyotrophic lateral sclerosis: Secondary | ICD-10-CM | POA: Diagnosis not present

## 2024-11-19 DIAGNOSIS — I482 Chronic atrial fibrillation, unspecified: Secondary | ICD-10-CM | POA: Diagnosis not present

## 2024-11-19 DIAGNOSIS — J9621 Acute and chronic respiratory failure with hypoxia: Secondary | ICD-10-CM | POA: Diagnosis not present

## 2024-11-20 DIAGNOSIS — Z930 Tracheostomy status: Secondary | ICD-10-CM | POA: Diagnosis not present

## 2024-11-20 DIAGNOSIS — I482 Chronic atrial fibrillation, unspecified: Secondary | ICD-10-CM | POA: Diagnosis not present

## 2024-11-20 DIAGNOSIS — G1221 Amyotrophic lateral sclerosis: Secondary | ICD-10-CM | POA: Diagnosis not present

## 2024-11-20 DIAGNOSIS — J9621 Acute and chronic respiratory failure with hypoxia: Secondary | ICD-10-CM | POA: Diagnosis not present

## 2024-11-21 DIAGNOSIS — I482 Chronic atrial fibrillation, unspecified: Secondary | ICD-10-CM | POA: Diagnosis not present

## 2024-11-21 DIAGNOSIS — Z930 Tracheostomy status: Secondary | ICD-10-CM | POA: Diagnosis not present

## 2024-11-21 DIAGNOSIS — J9621 Acute and chronic respiratory failure with hypoxia: Secondary | ICD-10-CM | POA: Diagnosis not present

## 2024-11-21 DIAGNOSIS — G1221 Amyotrophic lateral sclerosis: Secondary | ICD-10-CM | POA: Diagnosis not present

## 2024-11-21 LAB — COMPREHENSIVE METABOLIC PANEL WITH GFR
ALT: 44 U/L (ref 0–44)
AST: 33 U/L (ref 15–41)
Albumin: 2.7 g/dL — ABNORMAL LOW (ref 3.5–5.0)
Alkaline Phosphatase: 103 U/L (ref 38–126)
Anion gap: 12 (ref 5–15)
BUN: 27 mg/dL — ABNORMAL HIGH (ref 8–23)
CO2: 35 mmol/L — ABNORMAL HIGH (ref 22–32)
Calcium: 9.3 mg/dL (ref 8.9–10.3)
Chloride: 94 mmol/L — ABNORMAL LOW (ref 98–111)
Creatinine, Ser: 0.35 mg/dL — ABNORMAL LOW (ref 0.44–1.00)
GFR, Estimated: >60 mL/min (ref >60)
Glucose, Bld: 146 mg/dL — ABNORMAL HIGH (ref 70–99)
Potassium: 4.3 mmol/L (ref 3.5–5.1)
Sodium: 141 mmol/L (ref 135–145)
Total Bilirubin: 0.2 mg/dL (ref 0.0–1.2)
Total Protein: 6 g/dL — ABNORMAL LOW (ref 6.5–8.1)

## 2024-11-21 LAB — CBC WITH DIFFERENTIAL/PLATELET
Abs Immature Granulocytes: 0 K/uL (ref 0.00–0.07)
Basophils Absolute: 0 K/uL (ref 0.0–0.1)
Basophils Relative: 1 %
Eosinophils Absolute: 0.1 K/uL (ref 0.0–0.5)
Eosinophils Relative: 3 %
HCT: 32.8 % — ABNORMAL LOW (ref 36.0–46.0)
Hemoglobin: 10.2 g/dL — ABNORMAL LOW (ref 12.0–15.0)
Immature Granulocytes: 0 %
Lymphocytes Relative: 16 %
Lymphs Abs: 0.7 K/uL (ref 0.7–4.0)
MCH: 27.3 pg (ref 26.0–34.0)
MCHC: 31.1 g/dL (ref 30.0–36.0)
MCV: 87.7 fL (ref 80.0–100.0)
Monocytes Absolute: 0.7 K/uL (ref 0.1–1.0)
Monocytes Relative: 17 %
Neutro Abs: 2.8 K/uL (ref 1.7–7.7)
Neutrophils Relative %: 63 %
Platelets: 185 K/uL (ref 150–400)
RBC: 3.74 MIL/uL — ABNORMAL LOW (ref 3.87–5.11)
RDW: 15.9 % — ABNORMAL HIGH (ref 11.5–15.5)
WBC: 4.4 K/uL (ref 4.0–10.5)
nRBC: 0 % (ref 0.0–0.2)

## 2024-11-21 LAB — PHOSPHORUS: Phosphorus: 3.8 mg/dL (ref 2.5–4.6)

## 2024-11-21 LAB — MAGNESIUM: Magnesium: 1.9 mg/dL (ref 1.7–2.4)

## 2024-11-22 DIAGNOSIS — G1221 Amyotrophic lateral sclerosis: Secondary | ICD-10-CM | POA: Diagnosis not present

## 2024-11-22 DIAGNOSIS — I482 Chronic atrial fibrillation, unspecified: Secondary | ICD-10-CM | POA: Diagnosis not present

## 2024-11-22 DIAGNOSIS — Z930 Tracheostomy status: Secondary | ICD-10-CM | POA: Diagnosis not present

## 2024-11-22 DIAGNOSIS — J9621 Acute and chronic respiratory failure with hypoxia: Secondary | ICD-10-CM | POA: Diagnosis not present

## 2024-11-23 DIAGNOSIS — J9621 Acute and chronic respiratory failure with hypoxia: Secondary | ICD-10-CM | POA: Diagnosis not present

## 2024-11-23 DIAGNOSIS — Z930 Tracheostomy status: Secondary | ICD-10-CM | POA: Diagnosis not present

## 2024-11-23 DIAGNOSIS — I482 Chronic atrial fibrillation, unspecified: Secondary | ICD-10-CM | POA: Diagnosis not present

## 2024-11-23 DIAGNOSIS — G1221 Amyotrophic lateral sclerosis: Secondary | ICD-10-CM | POA: Diagnosis not present

## 2024-11-24 DIAGNOSIS — J9621 Acute and chronic respiratory failure with hypoxia: Secondary | ICD-10-CM | POA: Diagnosis not present

## 2024-11-24 DIAGNOSIS — Z930 Tracheostomy status: Secondary | ICD-10-CM | POA: Diagnosis not present

## 2024-11-24 DIAGNOSIS — I482 Chronic atrial fibrillation, unspecified: Secondary | ICD-10-CM | POA: Diagnosis not present

## 2024-11-24 DIAGNOSIS — G1221 Amyotrophic lateral sclerosis: Secondary | ICD-10-CM | POA: Diagnosis not present

## 2024-11-24 LAB — CBC
HCT: 39 % (ref 36.0–46.0)
Hemoglobin: 12.1 g/dL (ref 12.0–15.0)
MCH: 27.7 pg (ref 26.0–34.0)
MCHC: 31 g/dL (ref 30.0–36.0)
MCV: 89.2 fL (ref 80.0–100.0)
Platelets: 175 K/uL (ref 150–400)
RBC: 4.37 MIL/uL (ref 3.87–5.11)
RDW: 16.3 % — ABNORMAL HIGH (ref 11.5–15.5)
WBC: 4.2 K/uL (ref 4.0–10.5)
nRBC: 0 % (ref 0.0–0.2)

## 2024-11-24 LAB — BASIC METABOLIC PANEL WITH GFR
Anion gap: 11 (ref 5–15)
BUN: 30 mg/dL — ABNORMAL HIGH (ref 8–23)
CO2: 33 mmol/L — ABNORMAL HIGH (ref 22–32)
Calcium: 9.5 mg/dL (ref 8.9–10.3)
Chloride: 99 mmol/L (ref 98–111)
Creatinine, Ser: 0.33 mg/dL — ABNORMAL LOW (ref 0.44–1.00)
GFR, Estimated: >60 mL/min (ref >60)
Glucose, Bld: 149 mg/dL — ABNORMAL HIGH (ref 70–99)
Potassium: 4.4 mmol/L (ref 3.5–5.1)
Sodium: 143 mmol/L (ref 135–145)

## 2024-11-25 DIAGNOSIS — J9621 Acute and chronic respiratory failure with hypoxia: Secondary | ICD-10-CM | POA: Diagnosis not present

## 2024-11-25 DIAGNOSIS — I482 Chronic atrial fibrillation, unspecified: Secondary | ICD-10-CM | POA: Diagnosis not present

## 2024-11-25 DIAGNOSIS — Z930 Tracheostomy status: Secondary | ICD-10-CM | POA: Diagnosis not present

## 2024-11-25 DIAGNOSIS — G1221 Amyotrophic lateral sclerosis: Secondary | ICD-10-CM | POA: Diagnosis not present

## 2024-11-26 DIAGNOSIS — Z930 Tracheostomy status: Secondary | ICD-10-CM | POA: Diagnosis not present

## 2024-11-26 DIAGNOSIS — J9621 Acute and chronic respiratory failure with hypoxia: Secondary | ICD-10-CM | POA: Diagnosis not present

## 2024-11-26 DIAGNOSIS — G1221 Amyotrophic lateral sclerosis: Secondary | ICD-10-CM | POA: Diagnosis not present

## 2024-11-26 DIAGNOSIS — I482 Chronic atrial fibrillation, unspecified: Secondary | ICD-10-CM | POA: Diagnosis not present

## 2024-11-27 DIAGNOSIS — Z930 Tracheostomy status: Secondary | ICD-10-CM | POA: Diagnosis not present

## 2024-11-27 DIAGNOSIS — G1221 Amyotrophic lateral sclerosis: Secondary | ICD-10-CM | POA: Diagnosis not present

## 2024-11-27 DIAGNOSIS — J9621 Acute and chronic respiratory failure with hypoxia: Secondary | ICD-10-CM | POA: Diagnosis not present

## 2024-11-27 DIAGNOSIS — I482 Chronic atrial fibrillation, unspecified: Secondary | ICD-10-CM | POA: Diagnosis not present

## 2024-12-02 LAB — COMPREHENSIVE METABOLIC PANEL WITH GFR
ALT: 61 U/L — ABNORMAL HIGH (ref 0–44)
AST: 40 U/L (ref 15–41)
Albumin: 3.2 g/dL — ABNORMAL LOW (ref 3.5–5.0)
Alkaline Phosphatase: 112 U/L (ref 38–126)
Anion gap: 5 (ref 5–15)
BUN: 23 mg/dL (ref 8–23)
CO2: 37 mmol/L — ABNORMAL HIGH (ref 22–32)
Calcium: 9.6 mg/dL (ref 8.9–10.3)
Chloride: 99 mmol/L (ref 98–111)
Creatinine, Ser: 0.32 mg/dL — ABNORMAL LOW (ref 0.44–1.00)
GFR, Estimated: >60 mL/min (ref >60)
Glucose, Bld: 138 mg/dL — ABNORMAL HIGH (ref 70–99)
Potassium: 4.2 mmol/L (ref 3.5–5.1)
Sodium: 141 mmol/L (ref 135–145)
Total Bilirubin: 0.3 mg/dL (ref 0.0–1.2)
Total Protein: 7 g/dL (ref 6.5–8.1)

## 2024-12-02 LAB — MAGNESIUM: Magnesium: 1.9 mg/dL (ref 1.7–2.4)

## 2024-12-02 LAB — CBC
HCT: 39 % (ref 36.0–46.0)
Hemoglobin: 11.9 g/dL — ABNORMAL LOW (ref 12.0–15.0)
MCH: 27.6 pg (ref 26.0–34.0)
MCHC: 30.5 g/dL (ref 30.0–36.0)
MCV: 90.5 fL (ref 80.0–100.0)
Platelets: 222 K/uL (ref 150–400)
RBC: 4.31 MIL/uL (ref 3.87–5.11)
RDW: 16.6 % — ABNORMAL HIGH (ref 11.5–15.5)
WBC: 4.3 K/uL (ref 4.0–10.5)
nRBC: 0 % (ref 0.0–0.2)

## 2024-12-07 LAB — BASIC METABOLIC PANEL WITH GFR
Anion gap: 11 (ref 5–15)
BUN: 30 mg/dL — ABNORMAL HIGH (ref 8–23)
CO2: 29 mmol/L (ref 22–32)
Calcium: 9.1 mg/dL (ref 8.9–10.3)
Chloride: 103 mmol/L (ref 98–111)
Creatinine, Ser: 0.33 mg/dL — ABNORMAL LOW (ref 0.44–1.00)
GFR, Estimated: >60 mL/min (ref >60)
Glucose, Bld: 103 mg/dL — ABNORMAL HIGH (ref 70–99)
Potassium: 4.3 mmol/L (ref 3.5–5.1)
Sodium: 143 mmol/L (ref 135–145)

## 2024-12-07 LAB — CBC
HCT: 36.4 % (ref 36.0–46.0)
Hemoglobin: 11.3 g/dL — ABNORMAL LOW (ref 12.0–15.0)
MCH: 27.9 pg (ref 26.0–34.0)
MCHC: 31 g/dL (ref 30.0–36.0)
MCV: 89.9 fL (ref 80.0–100.0)
Platelets: 112 K/uL — ABNORMAL LOW (ref 150–400)
RBC: 4.05 MIL/uL (ref 3.87–5.11)
RDW: 16.4 % — ABNORMAL HIGH (ref 11.5–15.5)
WBC: 6.2 K/uL (ref 4.0–10.5)
nRBC: 0 % (ref 0.0–0.2)

## 2024-12-07 LAB — MAGNESIUM: Magnesium: 1.8 mg/dL (ref 1.7–2.4)

## 2024-12-08 LAB — BASIC METABOLIC PANEL WITH GFR
Anion gap: 9 (ref 5–15)
BUN: 28 mg/dL — ABNORMAL HIGH (ref 8–23)
CO2: 34 mmol/L — ABNORMAL HIGH (ref 22–32)
Calcium: 9.2 mg/dL (ref 8.9–10.3)
Chloride: 100 mmol/L (ref 98–111)
Creatinine, Ser: 0.47 mg/dL (ref 0.44–1.00)
GFR, Estimated: >60 mL/min (ref >60)
Glucose, Bld: 86 mg/dL (ref 70–99)
Potassium: 4.1 mmol/L (ref 3.5–5.1)
Sodium: 143 mmol/L (ref 135–145)

## 2024-12-08 LAB — CBC
HCT: 37 % (ref 36.0–46.0)
Hemoglobin: 11.3 g/dL — ABNORMAL LOW (ref 12.0–15.0)
MCH: 27.4 pg (ref 26.0–34.0)
MCHC: 30.5 g/dL (ref 30.0–36.0)
MCV: 89.8 fL (ref 80.0–100.0)
Platelets: 216 K/uL (ref 150–400)
RBC: 4.12 MIL/uL (ref 3.87–5.11)
RDW: 16.4 % — ABNORMAL HIGH (ref 11.5–15.5)
WBC: 4.5 K/uL (ref 4.0–10.5)
nRBC: 0 % (ref 0.0–0.2)

## 2024-12-09 DIAGNOSIS — G1221 Amyotrophic lateral sclerosis: Secondary | ICD-10-CM | POA: Diagnosis not present

## 2024-12-09 DIAGNOSIS — I482 Chronic atrial fibrillation, unspecified: Secondary | ICD-10-CM | POA: Diagnosis not present

## 2024-12-09 DIAGNOSIS — Z930 Tracheostomy status: Secondary | ICD-10-CM | POA: Diagnosis not present

## 2024-12-09 DIAGNOSIS — J9621 Acute and chronic respiratory failure with hypoxia: Secondary | ICD-10-CM | POA: Diagnosis not present

## 2024-12-10 DIAGNOSIS — I482 Chronic atrial fibrillation, unspecified: Secondary | ICD-10-CM | POA: Diagnosis not present

## 2024-12-10 DIAGNOSIS — J9621 Acute and chronic respiratory failure with hypoxia: Secondary | ICD-10-CM | POA: Diagnosis not present

## 2024-12-10 DIAGNOSIS — G1221 Amyotrophic lateral sclerosis: Secondary | ICD-10-CM | POA: Diagnosis not present

## 2024-12-10 DIAGNOSIS — Z930 Tracheostomy status: Secondary | ICD-10-CM | POA: Diagnosis not present

## 2024-12-11 DIAGNOSIS — I482 Chronic atrial fibrillation, unspecified: Secondary | ICD-10-CM | POA: Diagnosis not present

## 2024-12-11 DIAGNOSIS — Z930 Tracheostomy status: Secondary | ICD-10-CM | POA: Diagnosis not present

## 2024-12-11 DIAGNOSIS — J9621 Acute and chronic respiratory failure with hypoxia: Secondary | ICD-10-CM | POA: Diagnosis not present

## 2024-12-11 DIAGNOSIS — G1221 Amyotrophic lateral sclerosis: Secondary | ICD-10-CM | POA: Diagnosis not present

## 2024-12-12 DIAGNOSIS — I482 Chronic atrial fibrillation, unspecified: Secondary | ICD-10-CM | POA: Diagnosis not present

## 2024-12-12 DIAGNOSIS — G1221 Amyotrophic lateral sclerosis: Secondary | ICD-10-CM | POA: Diagnosis not present

## 2024-12-12 DIAGNOSIS — J9621 Acute and chronic respiratory failure with hypoxia: Secondary | ICD-10-CM | POA: Diagnosis not present

## 2024-12-12 DIAGNOSIS — Z930 Tracheostomy status: Secondary | ICD-10-CM | POA: Diagnosis not present

## 2024-12-13 DIAGNOSIS — Z930 Tracheostomy status: Secondary | ICD-10-CM | POA: Diagnosis not present

## 2024-12-13 DIAGNOSIS — J9621 Acute and chronic respiratory failure with hypoxia: Secondary | ICD-10-CM | POA: Diagnosis not present

## 2024-12-13 DIAGNOSIS — I482 Chronic atrial fibrillation, unspecified: Secondary | ICD-10-CM | POA: Diagnosis not present

## 2024-12-13 DIAGNOSIS — G1221 Amyotrophic lateral sclerosis: Secondary | ICD-10-CM | POA: Diagnosis not present

## 2024-12-14 DIAGNOSIS — J9621 Acute and chronic respiratory failure with hypoxia: Secondary | ICD-10-CM | POA: Diagnosis not present

## 2024-12-14 DIAGNOSIS — G1221 Amyotrophic lateral sclerosis: Secondary | ICD-10-CM | POA: Diagnosis not present

## 2024-12-14 DIAGNOSIS — Z930 Tracheostomy status: Secondary | ICD-10-CM | POA: Diagnosis not present

## 2024-12-14 DIAGNOSIS — I482 Chronic atrial fibrillation, unspecified: Secondary | ICD-10-CM | POA: Diagnosis not present

## 2024-12-14 LAB — BASIC METABOLIC PANEL WITH GFR
Anion gap: 7 (ref 5–15)
BUN: 25 mg/dL — ABNORMAL HIGH (ref 8–23)
CO2: 37 mmol/L — ABNORMAL HIGH (ref 22–32)
Calcium: 9.6 mg/dL (ref 8.9–10.3)
Chloride: 100 mmol/L (ref 98–111)
Creatinine, Ser: 0.41 mg/dL — ABNORMAL LOW (ref 0.44–1.00)
GFR, Estimated: >60 mL/min
Glucose, Bld: 139 mg/dL — ABNORMAL HIGH (ref 70–99)
Potassium: 4.1 mmol/L (ref 3.5–5.1)
Sodium: 143 mmol/L (ref 135–145)

## 2024-12-14 LAB — MAGNESIUM: Magnesium: 2.1 mg/dL (ref 1.7–2.4)

## 2024-12-14 LAB — CBC
HCT: 39.9 % (ref 36.0–46.0)
Hemoglobin: 12 g/dL (ref 12.0–15.0)
MCH: 27.6 pg (ref 26.0–34.0)
MCHC: 30.1 g/dL (ref 30.0–36.0)
MCV: 91.9 fL (ref 80.0–100.0)
Platelets: 245 K/uL (ref 150–400)
RBC: 4.34 MIL/uL (ref 3.87–5.11)
RDW: 16.9 % — ABNORMAL HIGH (ref 11.5–15.5)
WBC: 4.2 K/uL (ref 4.0–10.5)
nRBC: 0 % (ref 0.0–0.2)

## 2024-12-15 DIAGNOSIS — G1221 Amyotrophic lateral sclerosis: Secondary | ICD-10-CM | POA: Diagnosis not present

## 2024-12-15 DIAGNOSIS — Z930 Tracheostomy status: Secondary | ICD-10-CM | POA: Diagnosis not present

## 2024-12-15 DIAGNOSIS — I482 Chronic atrial fibrillation, unspecified: Secondary | ICD-10-CM | POA: Diagnosis not present

## 2024-12-15 DIAGNOSIS — J9621 Acute and chronic respiratory failure with hypoxia: Secondary | ICD-10-CM | POA: Diagnosis not present

## 2024-12-16 DIAGNOSIS — J9621 Acute and chronic respiratory failure with hypoxia: Secondary | ICD-10-CM | POA: Diagnosis not present

## 2024-12-16 DIAGNOSIS — I482 Chronic atrial fibrillation, unspecified: Secondary | ICD-10-CM | POA: Diagnosis not present

## 2024-12-16 DIAGNOSIS — G1221 Amyotrophic lateral sclerosis: Secondary | ICD-10-CM | POA: Diagnosis not present

## 2024-12-16 DIAGNOSIS — Z930 Tracheostomy status: Secondary | ICD-10-CM | POA: Diagnosis not present

## 2024-12-17 DIAGNOSIS — G1221 Amyotrophic lateral sclerosis: Secondary | ICD-10-CM

## 2024-12-17 DIAGNOSIS — J9621 Acute and chronic respiratory failure with hypoxia: Secondary | ICD-10-CM

## 2024-12-17 DIAGNOSIS — I482 Chronic atrial fibrillation, unspecified: Secondary | ICD-10-CM

## 2024-12-17 DIAGNOSIS — Z930 Tracheostomy status: Secondary | ICD-10-CM

## 2024-12-18 DIAGNOSIS — Z930 Tracheostomy status: Secondary | ICD-10-CM | POA: Diagnosis not present

## 2024-12-18 DIAGNOSIS — G1221 Amyotrophic lateral sclerosis: Secondary | ICD-10-CM | POA: Diagnosis not present

## 2024-12-18 DIAGNOSIS — I482 Chronic atrial fibrillation, unspecified: Secondary | ICD-10-CM | POA: Diagnosis not present

## 2024-12-18 DIAGNOSIS — J9621 Acute and chronic respiratory failure with hypoxia: Secondary | ICD-10-CM | POA: Diagnosis not present

## 2024-12-19 DIAGNOSIS — J9621 Acute and chronic respiratory failure with hypoxia: Secondary | ICD-10-CM | POA: Diagnosis not present

## 2024-12-19 DIAGNOSIS — G1221 Amyotrophic lateral sclerosis: Secondary | ICD-10-CM | POA: Diagnosis not present

## 2024-12-19 DIAGNOSIS — I482 Chronic atrial fibrillation, unspecified: Secondary | ICD-10-CM | POA: Diagnosis not present

## 2024-12-19 DIAGNOSIS — Z930 Tracheostomy status: Secondary | ICD-10-CM | POA: Diagnosis not present

## 2024-12-20 DIAGNOSIS — I482 Chronic atrial fibrillation, unspecified: Secondary | ICD-10-CM | POA: Diagnosis not present

## 2024-12-20 DIAGNOSIS — J9621 Acute and chronic respiratory failure with hypoxia: Secondary | ICD-10-CM | POA: Diagnosis not present

## 2024-12-20 DIAGNOSIS — G1221 Amyotrophic lateral sclerosis: Secondary | ICD-10-CM | POA: Diagnosis not present

## 2024-12-20 DIAGNOSIS — Z930 Tracheostomy status: Secondary | ICD-10-CM | POA: Diagnosis not present

## 2024-12-21 DIAGNOSIS — J9621 Acute and chronic respiratory failure with hypoxia: Secondary | ICD-10-CM | POA: Diagnosis not present

## 2024-12-21 DIAGNOSIS — I482 Chronic atrial fibrillation, unspecified: Secondary | ICD-10-CM | POA: Diagnosis not present

## 2024-12-21 DIAGNOSIS — G1221 Amyotrophic lateral sclerosis: Secondary | ICD-10-CM | POA: Diagnosis not present

## 2024-12-21 DIAGNOSIS — Z930 Tracheostomy status: Secondary | ICD-10-CM | POA: Diagnosis not present

## 2024-12-22 DIAGNOSIS — J9621 Acute and chronic respiratory failure with hypoxia: Secondary | ICD-10-CM | POA: Diagnosis not present

## 2024-12-22 DIAGNOSIS — Z930 Tracheostomy status: Secondary | ICD-10-CM | POA: Diagnosis not present

## 2024-12-22 DIAGNOSIS — G1221 Amyotrophic lateral sclerosis: Secondary | ICD-10-CM | POA: Diagnosis not present

## 2024-12-22 DIAGNOSIS — I482 Chronic atrial fibrillation, unspecified: Secondary | ICD-10-CM | POA: Diagnosis not present

## 2024-12-23 LAB — CBC
HCT: 40.5 % (ref 36.0–46.0)
Hemoglobin: 12.5 g/dL (ref 12.0–15.0)
MCH: 28.6 pg (ref 26.0–34.0)
MCHC: 30.9 g/dL (ref 30.0–36.0)
MCV: 92.7 fL (ref 80.0–100.0)
Platelets: 201 K/uL (ref 150–400)
RBC: 4.37 MIL/uL (ref 3.87–5.11)
RDW: 16.8 % — ABNORMAL HIGH (ref 11.5–15.5)
WBC: 6.9 K/uL (ref 4.0–10.5)
nRBC: 0 % (ref 0.0–0.2)

## 2024-12-23 LAB — COMPREHENSIVE METABOLIC PANEL WITH GFR
ALT: 45 U/L — ABNORMAL HIGH (ref 0–44)
AST: 24 U/L (ref 15–41)
Albumin: 3.6 g/dL (ref 3.5–5.0)
Alkaline Phosphatase: 86 U/L (ref 38–126)
Anion gap: 7 (ref 5–15)
BUN: 30 mg/dL — ABNORMAL HIGH (ref 8–23)
CO2: 37 mmol/L — ABNORMAL HIGH (ref 22–32)
Calcium: 10 mg/dL (ref 8.9–10.3)
Chloride: 105 mmol/L (ref 98–111)
Creatinine, Ser: 0.41 mg/dL — ABNORMAL LOW (ref 0.44–1.00)
GFR, Estimated: >60 mL/min
Glucose, Bld: 145 mg/dL — ABNORMAL HIGH (ref 70–99)
Potassium: 4.6 mmol/L (ref 3.5–5.1)
Sodium: 149 mmol/L — ABNORMAL HIGH (ref 135–145)
Total Bilirubin: 0.2 mg/dL (ref 0.0–1.2)
Total Protein: 7 g/dL (ref 6.5–8.1)

## 2024-12-23 LAB — MAGNESIUM: Magnesium: 2.1 mg/dL (ref 1.7–2.4)

## 2024-12-23 LAB — PHOSPHORUS: Phosphorus: 3.5 mg/dL (ref 2.5–4.6)

## 2024-12-24 DIAGNOSIS — Z930 Tracheostomy status: Secondary | ICD-10-CM | POA: Diagnosis not present

## 2024-12-24 DIAGNOSIS — J9621 Acute and chronic respiratory failure with hypoxia: Secondary | ICD-10-CM | POA: Diagnosis not present

## 2024-12-24 DIAGNOSIS — G1221 Amyotrophic lateral sclerosis: Secondary | ICD-10-CM | POA: Diagnosis not present

## 2024-12-24 DIAGNOSIS — I482 Chronic atrial fibrillation, unspecified: Secondary | ICD-10-CM | POA: Diagnosis not present

## 2024-12-25 DIAGNOSIS — J9621 Acute and chronic respiratory failure with hypoxia: Secondary | ICD-10-CM | POA: Diagnosis not present

## 2024-12-25 DIAGNOSIS — Z930 Tracheostomy status: Secondary | ICD-10-CM | POA: Diagnosis not present

## 2024-12-25 DIAGNOSIS — I482 Chronic atrial fibrillation, unspecified: Secondary | ICD-10-CM | POA: Diagnosis not present

## 2024-12-25 DIAGNOSIS — G1221 Amyotrophic lateral sclerosis: Secondary | ICD-10-CM | POA: Diagnosis not present

## 2024-12-25 LAB — COMPREHENSIVE METABOLIC PANEL WITH GFR
ALT: 33 U/L (ref 0–44)
AST: 22 U/L (ref 15–41)
Albumin: 3.5 g/dL (ref 3.5–5.0)
Alkaline Phosphatase: 89 U/L (ref 38–126)
Anion gap: 8 (ref 5–15)
BUN: 32 mg/dL — ABNORMAL HIGH (ref 8–23)
CO2: 35 mmol/L — ABNORMAL HIGH (ref 22–32)
Calcium: 9.8 mg/dL (ref 8.9–10.3)
Chloride: 106 mmol/L (ref 98–111)
Creatinine, Ser: 0.38 mg/dL — ABNORMAL LOW (ref 0.44–1.00)
GFR, Estimated: >60 mL/min
Glucose, Bld: 136 mg/dL — ABNORMAL HIGH (ref 70–99)
Potassium: 4.5 mmol/L (ref 3.5–5.1)
Sodium: 149 mmol/L — ABNORMAL HIGH (ref 135–145)
Total Bilirubin: 0.2 mg/dL (ref 0.0–1.2)
Total Protein: 7.1 g/dL (ref 6.5–8.1)

## 2024-12-25 LAB — CBC
HCT: 38.4 % (ref 36.0–46.0)
Hemoglobin: 11.9 g/dL — ABNORMAL LOW (ref 12.0–15.0)
MCH: 28.1 pg (ref 26.0–34.0)
MCHC: 31 g/dL (ref 30.0–36.0)
MCV: 90.8 fL (ref 80.0–100.0)
Platelets: 233 K/uL (ref 150–400)
RBC: 4.23 MIL/uL (ref 3.87–5.11)
RDW: 16.5 % — ABNORMAL HIGH (ref 11.5–15.5)
WBC: 7.3 K/uL (ref 4.0–10.5)
nRBC: 0 % (ref 0.0–0.2)

## 2024-12-25 LAB — MAGNESIUM: Magnesium: 2.1 mg/dL (ref 1.7–2.4)

## 2024-12-28 LAB — BASIC METABOLIC PANEL WITH GFR
Anion gap: 5 (ref 5–15)
BUN: 28 mg/dL — ABNORMAL HIGH (ref 8–23)
CO2: 37 mmol/L — ABNORMAL HIGH (ref 22–32)
Calcium: 9.5 mg/dL (ref 8.9–10.3)
Chloride: 103 mmol/L (ref 98–111)
Creatinine, Ser: 0.42 mg/dL — ABNORMAL LOW (ref 0.44–1.00)
GFR, Estimated: >60 mL/min
Glucose, Bld: 97 mg/dL (ref 70–99)
Potassium: 4.5 mmol/L (ref 3.5–5.1)
Sodium: 144 mmol/L (ref 135–145)

## 2024-12-29 DIAGNOSIS — Z930 Tracheostomy status: Secondary | ICD-10-CM | POA: Diagnosis not present

## 2024-12-29 DIAGNOSIS — J9621 Acute and chronic respiratory failure with hypoxia: Secondary | ICD-10-CM | POA: Diagnosis not present

## 2024-12-29 DIAGNOSIS — G1221 Amyotrophic lateral sclerosis: Secondary | ICD-10-CM | POA: Diagnosis not present

## 2024-12-29 DIAGNOSIS — I482 Chronic atrial fibrillation, unspecified: Secondary | ICD-10-CM | POA: Diagnosis not present

## 2024-12-30 ENCOUNTER — Other Ambulatory Visit (HOSPITAL_COMMUNITY)

## 2024-12-30 DIAGNOSIS — I482 Chronic atrial fibrillation, unspecified: Secondary | ICD-10-CM | POA: Diagnosis not present

## 2024-12-30 DIAGNOSIS — Z930 Tracheostomy status: Secondary | ICD-10-CM | POA: Diagnosis not present

## 2024-12-30 DIAGNOSIS — J9621 Acute and chronic respiratory failure with hypoxia: Secondary | ICD-10-CM | POA: Diagnosis not present

## 2024-12-30 DIAGNOSIS — G1221 Amyotrophic lateral sclerosis: Secondary | ICD-10-CM | POA: Diagnosis not present

## 2024-12-31 DIAGNOSIS — J9621 Acute and chronic respiratory failure with hypoxia: Secondary | ICD-10-CM | POA: Diagnosis not present

## 2024-12-31 DIAGNOSIS — G1221 Amyotrophic lateral sclerosis: Secondary | ICD-10-CM | POA: Diagnosis not present

## 2024-12-31 DIAGNOSIS — I482 Chronic atrial fibrillation, unspecified: Secondary | ICD-10-CM | POA: Diagnosis not present

## 2024-12-31 DIAGNOSIS — Z930 Tracheostomy status: Secondary | ICD-10-CM | POA: Diagnosis not present

## 2025-01-01 DIAGNOSIS — Z930 Tracheostomy status: Secondary | ICD-10-CM | POA: Diagnosis not present

## 2025-01-01 DIAGNOSIS — I482 Chronic atrial fibrillation, unspecified: Secondary | ICD-10-CM | POA: Diagnosis not present

## 2025-01-01 DIAGNOSIS — J9621 Acute and chronic respiratory failure with hypoxia: Secondary | ICD-10-CM | POA: Diagnosis not present

## 2025-01-01 DIAGNOSIS — G1221 Amyotrophic lateral sclerosis: Secondary | ICD-10-CM | POA: Diagnosis not present

## 2025-01-02 DIAGNOSIS — J9621 Acute and chronic respiratory failure with hypoxia: Secondary | ICD-10-CM | POA: Diagnosis not present

## 2025-01-02 DIAGNOSIS — I482 Chronic atrial fibrillation, unspecified: Secondary | ICD-10-CM | POA: Diagnosis not present

## 2025-01-02 DIAGNOSIS — Z930 Tracheostomy status: Secondary | ICD-10-CM | POA: Diagnosis not present

## 2025-01-02 DIAGNOSIS — G1221 Amyotrophic lateral sclerosis: Secondary | ICD-10-CM | POA: Diagnosis not present

## 2025-01-02 LAB — BASIC METABOLIC PANEL WITH GFR
Anion gap: 10 (ref 5–15)
BUN: 25 mg/dL — ABNORMAL HIGH (ref 8–23)
CO2: 33 mmol/L — ABNORMAL HIGH (ref 22–32)
Calcium: 9.5 mg/dL (ref 8.9–10.3)
Chloride: 99 mmol/L (ref 98–111)
Creatinine, Ser: 0.37 mg/dL — ABNORMAL LOW (ref 0.44–1.00)
GFR, Estimated: >60 mL/min
Glucose, Bld: 95 mg/dL (ref 70–99)
Potassium: 4.6 mmol/L (ref 3.5–5.1)
Sodium: 142 mmol/L (ref 135–145)

## 2025-01-02 LAB — CBC
HCT: 39.4 % (ref 36.0–46.0)
Hemoglobin: 12 g/dL (ref 12.0–15.0)
MCH: 27.8 pg (ref 26.0–34.0)
MCHC: 30.5 g/dL (ref 30.0–36.0)
MCV: 91.4 fL (ref 80.0–100.0)
Platelets: 263 K/uL (ref 150–400)
RBC: 4.31 MIL/uL (ref 3.87–5.11)
RDW: 16 % — ABNORMAL HIGH (ref 11.5–15.5)
WBC: 9.5 K/uL (ref 4.0–10.5)
nRBC: 0 % (ref 0.0–0.2)

## 2025-01-02 LAB — PHOSPHORUS: Phosphorus: 3 mg/dL (ref 2.5–4.6)

## 2025-01-02 LAB — MAGNESIUM: Magnesium: 2.1 mg/dL (ref 1.7–2.4)

## 2025-01-03 ENCOUNTER — Other Ambulatory Visit (HOSPITAL_COMMUNITY)

## 2025-01-03 DIAGNOSIS — Z930 Tracheostomy status: Secondary | ICD-10-CM | POA: Diagnosis not present

## 2025-01-03 DIAGNOSIS — G1221 Amyotrophic lateral sclerosis: Secondary | ICD-10-CM | POA: Diagnosis not present

## 2025-01-03 DIAGNOSIS — I482 Chronic atrial fibrillation, unspecified: Secondary | ICD-10-CM | POA: Diagnosis not present

## 2025-01-03 DIAGNOSIS — J9621 Acute and chronic respiratory failure with hypoxia: Secondary | ICD-10-CM | POA: Diagnosis not present

## 2025-01-04 DIAGNOSIS — J9621 Acute and chronic respiratory failure with hypoxia: Secondary | ICD-10-CM | POA: Diagnosis not present

## 2025-01-04 DIAGNOSIS — Z930 Tracheostomy status: Secondary | ICD-10-CM | POA: Diagnosis not present

## 2025-01-04 DIAGNOSIS — G1221 Amyotrophic lateral sclerosis: Secondary | ICD-10-CM | POA: Diagnosis not present

## 2025-01-04 DIAGNOSIS — I482 Chronic atrial fibrillation, unspecified: Secondary | ICD-10-CM | POA: Diagnosis not present

## 2025-01-05 DIAGNOSIS — G1221 Amyotrophic lateral sclerosis: Secondary | ICD-10-CM | POA: Diagnosis not present

## 2025-01-05 DIAGNOSIS — J9621 Acute and chronic respiratory failure with hypoxia: Secondary | ICD-10-CM | POA: Diagnosis not present

## 2025-01-05 DIAGNOSIS — Z930 Tracheostomy status: Secondary | ICD-10-CM | POA: Diagnosis not present

## 2025-01-05 DIAGNOSIS — I482 Chronic atrial fibrillation, unspecified: Secondary | ICD-10-CM | POA: Diagnosis not present

## 2025-01-06 DIAGNOSIS — J9621 Acute and chronic respiratory failure with hypoxia: Secondary | ICD-10-CM | POA: Diagnosis not present

## 2025-01-06 DIAGNOSIS — Z930 Tracheostomy status: Secondary | ICD-10-CM | POA: Diagnosis not present

## 2025-01-06 DIAGNOSIS — I482 Chronic atrial fibrillation, unspecified: Secondary | ICD-10-CM | POA: Diagnosis not present

## 2025-01-06 DIAGNOSIS — G1221 Amyotrophic lateral sclerosis: Secondary | ICD-10-CM | POA: Diagnosis not present

## 2025-01-07 DIAGNOSIS — I482 Chronic atrial fibrillation, unspecified: Secondary | ICD-10-CM | POA: Diagnosis not present

## 2025-01-07 DIAGNOSIS — G1221 Amyotrophic lateral sclerosis: Secondary | ICD-10-CM | POA: Diagnosis not present

## 2025-01-07 DIAGNOSIS — J9621 Acute and chronic respiratory failure with hypoxia: Secondary | ICD-10-CM | POA: Diagnosis not present

## 2025-01-07 DIAGNOSIS — Z930 Tracheostomy status: Secondary | ICD-10-CM | POA: Diagnosis not present

## 2025-01-08 DIAGNOSIS — Z930 Tracheostomy status: Secondary | ICD-10-CM | POA: Diagnosis not present

## 2025-01-08 DIAGNOSIS — J9621 Acute and chronic respiratory failure with hypoxia: Secondary | ICD-10-CM | POA: Diagnosis not present

## 2025-01-08 DIAGNOSIS — G1221 Amyotrophic lateral sclerosis: Secondary | ICD-10-CM | POA: Diagnosis not present

## 2025-01-08 DIAGNOSIS — I482 Chronic atrial fibrillation, unspecified: Secondary | ICD-10-CM | POA: Diagnosis not present

## 2025-01-09 DIAGNOSIS — J9621 Acute and chronic respiratory failure with hypoxia: Secondary | ICD-10-CM | POA: Diagnosis not present

## 2025-01-09 DIAGNOSIS — Z930 Tracheostomy status: Secondary | ICD-10-CM | POA: Diagnosis not present

## 2025-01-09 DIAGNOSIS — G1221 Amyotrophic lateral sclerosis: Secondary | ICD-10-CM | POA: Diagnosis not present

## 2025-01-09 DIAGNOSIS — I482 Chronic atrial fibrillation, unspecified: Secondary | ICD-10-CM | POA: Diagnosis not present

## 2025-01-10 DIAGNOSIS — J9621 Acute and chronic respiratory failure with hypoxia: Secondary | ICD-10-CM | POA: Diagnosis not present

## 2025-01-10 DIAGNOSIS — Z930 Tracheostomy status: Secondary | ICD-10-CM | POA: Diagnosis not present

## 2025-01-10 DIAGNOSIS — G1221 Amyotrophic lateral sclerosis: Secondary | ICD-10-CM | POA: Diagnosis not present

## 2025-01-10 DIAGNOSIS — I482 Chronic atrial fibrillation, unspecified: Secondary | ICD-10-CM | POA: Diagnosis not present

## 2025-01-11 DIAGNOSIS — G1221 Amyotrophic lateral sclerosis: Secondary | ICD-10-CM | POA: Diagnosis not present

## 2025-01-11 DIAGNOSIS — Z930 Tracheostomy status: Secondary | ICD-10-CM | POA: Diagnosis not present

## 2025-01-11 DIAGNOSIS — I482 Chronic atrial fibrillation, unspecified: Secondary | ICD-10-CM | POA: Diagnosis not present

## 2025-01-11 DIAGNOSIS — J9621 Acute and chronic respiratory failure with hypoxia: Secondary | ICD-10-CM | POA: Diagnosis not present

## 2025-01-12 DIAGNOSIS — G1221 Amyotrophic lateral sclerosis: Secondary | ICD-10-CM | POA: Diagnosis not present

## 2025-01-12 DIAGNOSIS — Z930 Tracheostomy status: Secondary | ICD-10-CM | POA: Diagnosis not present

## 2025-01-12 DIAGNOSIS — J9621 Acute and chronic respiratory failure with hypoxia: Secondary | ICD-10-CM | POA: Diagnosis not present

## 2025-01-12 DIAGNOSIS — I482 Chronic atrial fibrillation, unspecified: Secondary | ICD-10-CM | POA: Diagnosis not present

## 2025-01-12 NOTE — Progress Notes (Signed)
 "  PROGRESS NOTE  PULMONARY SERVICE ROUNDS   Dominique Brewer  DOB: 1946-12-14  Referring physician: Corean Dominique Daring, MD  HPI: Dominique Brewer  being seen for Acute on Chronic Respiratory Failure.  She is on the ventilator assist-control mode full support  Review of Systems: Unremarkable other than noted in HPI  Allergies:  Allergies[1]   Medications: Scheduled Meds: Scheduled/Continuous Medications     Scheduled         calcium carbonate (TUMS) chewable tablet 500 mg  500 mg, PO/Per Tube, TID     Electrolyte Replacement  No Dose/Rate, XX, Once a day     enoxaparin  (LOVENOX ) syringe 30 mg  30 mg, [place], Q24H SCH     latanoprost  (XALATAN ) 0.005 % ophthalmic solution 1 drop  1 drop, Both Eyes, Nightly     [Held by provider] lisinopril (ZESTRIL) tablet 2.5 mg On hold since yesterday at 1005 until manually unheld; held by Rogue LOISE Kiang, [facility] Reason: Other  2.5 mg, Tube, Once a day     magnesium  oxide tablet 400 mg  400 mg, PO/Per Tube, BID     senna (SENOKOT) tablet 17.2 mg  17.2 mg, PO/Per Tube, Nightly     thiamine  tablet 100 mg  100 mg, PO/Per Tube, Once a day     verapamil (CALAN) tablet 80 mg  80 mg, PO/Per Tube, Q12H SCH               Continuous Infusions:    PRN Meds:    acetaminophen    bisacodyl   DiphenhydrAMINE   magnesium  oxide **OR** magnesium  sulfate   potassium **OR** potassium chloride    Propylene Glycol-Glycerin   sodium chloride    Vitals: (Retired) Research Scientist (life Sciences) (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)     Date/Time Temp Pulse Resp BP SpO2 Weight   01/12/25 0835 -- 79 20 -- 97 % --   01/12/25 0800 96 F (35.6 C) 89 22 120/70 98 % --   01/12/25 0509 -- 70 18 -- -- --   01/12/25 0504 -- 70 18 -- 98 % --   01/12/25 0400 -- -- -- -- -- 102 lb 1.6 oz (46.3 kg)   01/12/25 0205 -- 80 22 -- 99 % --   01/12/25 0000 97.7 F (36.5 C) 68 20 120/65 98 % --   01/11/25 2309 -- 79 20 -- -- --   01/11/25 2017 -- 82 20 -- -- --   01/11/25  2012 -- 82 20 -- 95 % --   01/11/25 1940 98.7 F (37.1 C) 95 17 128/78 96 % --   01/11/25 1638 -- 72 23* -- 95 % --   01/11/25 1523 96.4 F (35.8 C) 79 18 133/73 98 % --   01/11/25 1418 -- 74 19 -- 98 % --   01/11/25 1124 96.2 F (35.7 C) 83 20 140/69 100 % --   01/11/25 1100 -- 72 21 -- 100 % --   01/11/25 0800 -- 84 21 -- 98 % --   01/11/25 0744 96.9 F (36.1 C) 89 18 118/57 100 % --   01/11/25 0507 -- 77 18 -- -- --   01/11/25 0502 -- 78 17 -- 100 % --   01/11/25 0400 97.3 F (36.3 C) 82 21 125/66 99 % 102 lb 1.6 oz (46.3 kg)   01/11/25 0204 -- 94 19 -- 99 % --   01/11/25 0000 96.9 F (36.1 C) 81 20 128/70 97 % --   01/10/25 2250 --  80 17 -- 100 % --   01/10/25 2030 -- 89 22 -- -- --   01/10/25 2025 -- 89 24* -- 99 % --   01/10/25 1940 96.6 F (35.9 C) 91 23* 150/68 98 % --   01/10/25 1805 -- 88 20 -- -- --   01/10/25 1800 -- 78 18 -- -- --   01/10/25 1721 -- 73 18 -- 99 % --   01/10/25 1500 97.7 F (36.5 C) 72 22 137/78 99 % --   01/10/25 1430 -- 73 19 -- 98 % --   01/10/25 1300 97.1 F (36.2 C) 80 23* 127/59 98 % --   01/10/25 1102 -- 80 20 -- 98 % --   01/10/25 0808 -- 77 20 -- 99 % --   01/10/25 0742 97 F (36.1 C) 89 22 115/73 97 % 101 lb (45.8 kg)   01/10/25 0602 -- 72 18 117/61 99 % --   01/10/25 0522 -- 80 20 -- -- --   01/10/25 0517 -- 80 20 -- 97 % --   01/10/25 0400 -- -- -- -- -- 101 lb (45.8 kg)   01/10/25 0217 -- 82 18 -- 99 % --   01/09/25 2253 -- 97 20 -- 96 % --   01/09/25 2024 -- 96 21 -- -- --   01/09/25 2019 -- 96 23* -- 97 % --   01/09/25 1930 97.3 F (36.3 C) 96 21 142/67 97 % --   01/09/25 1812 -- 91 20 -- 98 % --   01/09/25 1500 97.8 F (36.6 C) 77 19 106/56 97 % --   01/09/25 1455 -- 80 20 -- 98 % --   01/09/25 1359 96 F (35.6 C) 77 21 110/56 99 % --   01/09/25 1319 -- 80 19 -- 91 % --   01/09/25 1315 -- -- -- -- 87 %* --   01/09/25 1235 -- 80 19 -- 89 % --   01/09/25 0937 -- 80 20 -- -- --   01/09/25 0935 -- 86 22 -- 98 % --    01/09/25 0753 98.1 F (36.7 C) 88 22 118/66 98 % 101 lb (45.8 kg)   01/09/25 0525 -- 83 20 -- 99 % --   01/09/25 0429 98.6 F (37 C) 82 21 159/80* 98 % --   01/09/25 0400 -- -- -- -- -- 101 lb 8 oz (46 kg)   01/09/25 0204 -- 106 22 -- 98 % --   01/09/25 0005 98.6 F (37 C) 103 30* 164/72* 91 % --        Intake/Output: Intake & Output last 3 days                            01/10/25 0700 - 01/11/25 0659 01/11/25 0700 - 01/12/25 0659 01/12/25 0700 - 01/13/25 0659    [id number] 1900-0659 Total 0700-1859 1900-0659 Total 0700-1859 1900-0659 Total                 Intake   P.O.  --  0 0  0  -- 0  0  -- 0   NG/GT  620  900 1520  640  0 640  --  -- --   Total Intake [phone] 640 0 640 0 -- 0     Output   Emesis/NG output  --  0 0  0  0 0  --  -- --   Stool  --  0 0  --  -- --  --  -- --   Blood  --  0 0  --  -- --  --  -- --   Urine (voided)  --  0 0  --  200 200  --  -- --   Total Output -- 0 0 0 200 200 -- -- --     Net I/O    [phone] 640 -200 440 0 -- 0        Ventilator/Airway Settings: FiO2 (%):  [40 %] 40 % S RR:  [14] 14 S VT:  [380 mL or cc] 380 mL or cc PEEP (cmH2O):  [8 cmH2O] 8 cmH2O MAP (cm H2O):  [12-14] 12  Active Airways     Active Airways     Name Placement date Placement time Site Days   Surgical Airway Shiley Disposable Inner Canula;Cuffed 6 04/01/24  --  6  286             IV and Lines: Active Lines     Active Lines     Name Placement date Placement time Site Days   Midline Left Cephalic Vein - Upper Arm 12/25/24  --  Cephalic Vein - Upper Arm  18            Physical Exam: General:  calm and comfortable NAD ENT: grossly normal Neck: no masses grossly Respiratory: few scattered rhonchi Abdomen: non-distended Skin: no obvious rash Neurologic: no involuntary movements          Lab Data:          Invalid input(s): AA, GLF, CA, NEOBIL        Results for orders placed or performed during the hospital  encounter of 10/09/24 (from the past week)  POCT glucose   Collection Time: 01/06/25  6:28 AM  Result Value Ref Range Status   Glucose Blood, POC 122 70 - 120 mg/dL Final  POCT glucose   Collection Time: 01/07/25  4:55 AM  Result Value Ref Range Status   Glucose Blood, POC 179 70 - 120 mg/dL Final  POCT glucose   Collection Time: 01/08/25  5:23 AM  Result Value Ref Range Status   Glucose Blood, POC 81 70 - 120 mg/dL Final  POCT glucose   Collection Time: 01/09/25 10:36 PM  Result Value Ref Range Status   Glucose Blood, POC 110 70 - 120 mg/dL Final  POCT glucose   Collection Time: 01/10/25 10:26 PM  Result Value Ref Range Status   Glucose Blood, POC 86 70 - 120 mg/dL Final      Radiological Data:  DG Abd Portable 1V Result Date: 01/03/2025 EXAM: 1 VIEW XRAY OF THE ABDOMEN 01/03/2025 06:45:00 PM COMPARISON: 10/10/2024 CLINICAL HISTORY: Abdominal pain FINDINGS: LINES, TUBES AND DEVICES: Percutaneous gastrostomy tube in place over the gastric body. BOWEL: Nonobstructive bowel gas pattern. Large volume stool in rectum. No free intraperitoneal gas. SOFT TISSUES: No abnormal calcifications. BONES: No acute fracture. IMPRESSION: 1. Large volume stool in the rectum. 2. No acute abdominal abnormality. 3. Percutaneous gastrostomy tube in place over the gastric body. Electronically signed by: Dorethia Molt MD MD 01/03/2025 06:57 PM EST RP Workstation: [id number]  XR chest 1 vws AP portable Result Date: 12/30/2024 CLINICAL DATA:  Short of breath EXAM: PORTABLE CHEST 1 VIEW COMPARISON:  None Available. FINDINGS: Tracheostomy tube is in good position in the midline and at the level of the clavicles. Cardiac and mediastinal contours are unchanged. Mild hyperinflation with stable  chronic bronchitic changes. No new airspace infiltrate, atelectasis, pleural effusion or pneumothorax. No acute osseous abnormality. IMPRESSION: Tracheostomy tube remains in good position. Stable chest x-ray without acute  cardiopulmonary process. Electronically Signed   By: Wilkie Lent M.D.   On: 12/30/2024 12:57        RT PROGRESS/WEAN NOTES    Burnard Margarita, RT  Respiratory Therapist Specialty: Respiratory Therapy   Progress Notes     Signed   Date of Service: 01/12/2025  5:54 AM   Signed      Respiratory Progress Note Room #: 5E09  Admit Date: 10/09/2024  Pulmonologist:Dr Fernand  Discharge Date:  DX/HX: ALS, VDRF  Isolation:  Code Status: FULL    Airway Type? (Trach/ETT): Trach  Size: #6 Shiley  Cuffed/Cuffless?: cuffed  Placement at lip?:  High Risk Airway?:  Date of insertion: trached on 04/01/2024   Trach changed: 9/25 ? Per patient  PMV started: admitted on unit wearing pmv  Cap started:  Decannulated/extubated:    Ventilator ID Number: Current ventilator settings: AC/VC+, 14, 380, peep 8, fio2 40%  Current weaning orders: May wean up to 6 hrs ATC at pt's request  Date of first wean:  Date of liberation from vent:    Non-invasive Ventilation Current settings:   Home use? Yes/no:  Frequency? HS/PRN/Continuous:    Oxygen Therapy Modality? Nasal Cannula/NRB/Trach Collar/etc.:   FiO2:  LPM:     Treatments Drug: Dose: Frequency: Other:                                          RT Progress Date Time Important Event or Update RT Initials  10/09/2024 1818 New admit, pt has vent at home. Wears 35% ATC w/PMV during the day and Vent HS. Arrived wearing PMV, ATC 35%. Tolerating well, ABG drawn--pH 7.37, Co2 56, Po2 60, Hco3 32.2, spo2 91%. pt stable. Suctions mouth with yankauer.States large amount of secretions at times. CMG  10/09/2024 2143 Pt asked to be placed on vent, did not tolerate SIMV, vent setting changed accordingly, ACVC+ 14 380 35% +5 [facility]  10/10/2024 1803 28% ATC w/PMV during daytime. Tolerated well, to go on vent hs CMG  10/11/2024 0515 28% ATC W/ PMV DAYS, VENT NIGHT KN  10/11/2024 1829 28% ATC W/PMV STARTED AT 0858 JM  10/12/2024 0510 28% ATC W/PMV  DAYS,  VENT HS BEGAN ACVC 2025 KN  10/12/2024 1700 28% ATC W/PMV STARTED AT 0740  JM  10/13/2024 0256 ON AC/VC+ AT 2052 KN  10/13/2024 0557 28% ATC @0745 , 18hr daily goal, PMV for short periods BP  10/13/2024 1645 28% ATC w/PMV  goal for 18hrs ( started 0745) CMG  10/14/2024 0514 Placed on full vent support @2227  hrs due to decreased SAT in the low 80's OO  10/14/2024 1709  28% ATC w/PMV during day, pt to rest on vent hs CMG  10/15/24 0538 Placed on full support ventilation at 2225 and patient requested to come off vent at 0530 and placed on 28% ATC. SAP  10/15/2024 1642 ATC day/vent at night, 28% ATC with PMV NP  10/16/24 0004 RCVD pt on 28% ATC, placed pt on FSV @ 2230 NP  10/16/2024 1149 ATC day/vent at night, 28% ATC with PMV NP  10/17/24 0527 28% ATC/PMV. Placed on full support ventilation at 2015. SAP  10/17/2024 1751 ATC 30% during day. Pt with bloody secretions, and are improving. Rapid  called at 1625 due to tachycardia and increased wob, pt placed on full vent support. CMG  10/18/24 0603 Full support ventilation SAP  10/18/2024 1848 28% ATC w/PMV day, vent HS JM  10/19/2024 0509 Placed on full vent support @2055  hrs per request. Tolerating well. OO  10/19/2024 1536 28% ATC W/PMV DAYTIME FOR 93.25 HRS. BACK ON FULL VENT SUPPORT PER PT REQUEST DUE TO 'FEELING WEAK' JM  10/20/2024 0530 Full vent support. No changes. OO  10/20/2024 1807  28% ATC DAYTIME, PT REFUSED TT CHANGE JM  10/21/2024 0410 Placed on full vent support @2231  hrs per request by pt. OO  10/21/2024 0835 28% ATC w/PMV daytime BP  10/22/24 0218 Placed on full support ventilation at 2005 SAP  10/22/2024 0846 28% ATC w/PMV at 1025 BP  10/22/2024 2207 Pt went back on full vent support @2020  [facility]  10/23/2024 2314 Pt back on full vent support @ 2125 [facility]  10/23/2024 1728 ATC increased to 40%, 10L. PMV tolerated for short periods on and off, and while eating only. HL  10/24/2024 1758 Received patient on ATC 40%. Patient has been  placing and removing the PMV as needed. Patient has declined to be suctioned this afternoon. No distress noted. HL  10/24/2024 2129 Per patients request, placed on full vent support [facility]  10/25/2024 1812 ATC 40% since 0850. PMV used PRN by patient.  HL  10/26/2024 0520 Full vent support @0014  hrs.  OO  10/26/2024 1725 1050 35% ATC began for the day, pt used PMV short time today, said it was too hard to breathe with PMV CD  10/27/2024 0433 Full vent support @2046  hrs. OO  10/27/2024 0840 28% ATC w/PMV@0945  BP  10/28/2024 0200 Full support @2112  hrs . PRN neb given.  OO  10/28/2024 0844 28% ATC w/PMV@0845  BP  10/29/2024 0920 28% ATC w/PMV@0915  BP  10/29/2024 2116 Pt was on 28% ATC, went back on vent per her request @ 2031. [facility]  10/30/2024 1724 28% ATC w occasional PMV usage. Had it off the majority of the day CMG  10/30/2024 2209 28% ATC then vent for QHS [facility]  10/31/2024 1720 28% ATC days, PMV as tolerated, vent qhs CD  10/31/2024 2116 Pt on full vent support 2030 [facility]  11/01/2024 1728 28% ATC began 1025, PMV as tolerated, vent qhs CD  11/02/2024 0322 28% ATC DAYS W/ PMV,  VENT QHS KN  11/02/2024 1903 28% ATC DAYS W/ PMV    11/02/2024 2309 28% ATC W/ PMV, VENT QHS KN  11/03/2024 1754 28% ATC STARTED AT 0810, INTERMITTENT PMV USE JM  11/04/2024 0325 Increased FiO2 to 40% in response to adverse reaction to being placed on vent.  Titrated patient back to 28% ATC.  Patient is alert and oriented.  Placed patient on vent at 0230.  Patient tolerated vent settings well.  No signs of respiratory distress noted. DS  11/04/2024 1727 ATC 28%. PMV in place as tolerated. HL  11/05/2024 0642 Placed patient on vent at 0230.  Patient tolerated vent settings well.  No signs of respiratory distress noted. DS  11/05/2024 1536 Pt placed on 28% ATC at 0830 NP  11/05/2024 2149 28% ATC during day.  AC/VC during night DS  11/06/2024 1820 28% ATC started at 0750, vent at night NP  11/07/2024 0221 Placed back on vent at 2016 in full support  for the night. Requests frequent suctioning with little secretions. LN  11/07/2024 1730 28% ATC days, PMV as tolerated, vent qhs CD  11/08/2024 0536 28%  ATC W/ PMV DAYS, VENT QHS KN  11/08/2024 1725 28% ATC days, PMV as tolerated, vent at bedtime, pulse ox was not picking up this afternoon so RT put it on ear for reading, pt said she doesn't want it on ear or toes, placed on forehead but pt took it off CD  11/09/2024 0516 28% ATC W/ PMV DAYS, VENT QHS KN  11/09/2024 1716 ATC 28%. PMV used PRN by patient.  HL  11/10/2024 0236 28% ATC W/PMV DAYTIME, VENT AT NIGHT KN  11/10/2024 0730 28% ATC w/PMV@0730  BP  11/11/2024 0108 Patient placed on vent at 0000 DS  11/11/2024 0615 28% ATC w/PMV@0730  BP  11/12/2024 1217 Pt placed back on full support on the vent for the night LN  11/12/2024 0606 28% ATC w/PMV@0745  BP  11/12/2024 2339 ATC wean trial ended at 2320; placed patient on full support AC/VC+ DS  11/13/2024 1723 ATC 28% started at 1010. PMV used PRN. HL  11/14/2024 0442 Placed on full support vent when PT went to bed around 2200.  SB   11/14/2024 1756 Placed on ATC 28% @ 0815 without issue. Patient has declined to be suctioned twice this afternoon. PMV in place at this time. HL  11/15/2024 0512 28% ATC W/PMV DAYS, VENT AT NIGHT, PLACED ON AC/VC+ @ 2300 KN  11/15/2024 1508 Placed pt on ATC 28% w/PMV with goal being during the day. To rest on full vent setting HS CMG  11/16/2024 0529 28% ATC W/PMV DAYS, VENT AT NIGHT, AC/VC+ 2201 KN  11/16/2024 1745 28% ATC w/pmv day, vent hs. Patient requested to go on vent for a few hours for a break today. CMG  11/17/2024 0538 Placed back on vent at 2043 to rest LN  11/17/2024 0615 28% ATC w/PMV@1012  BP  11/18/2024 0058 Placed back on vent at 2136 to rest LN  11/18/2024 1452 28% ATC with PMV, started at 1020. NP  11/18/2024 2204 Per pt request, back on full vent support post trach care [facility]  11/19/2024 1551 Pt on 28% ATC from [phone]. Back to vent on full support  due to difficulty breathing, increased secretions. NP  11/19/2024 2123 Pt is on full vent support [facility]  11/20/2024 1707 Pt back on vent for 40 mins today due to increased work of breathing. 28% ATC (PMV on/off throughout the day) NP  11/21/2024 0642 ATC wean trial ended at 2318; placed patient on full support AC/VC+ DS  11/21/2024 1735 28% ATC until 1445 (PMV not on long), pt asked to go back on vent CD  11/21/2024 2359 Pt is full vent support [facility]  11/22/2024 1745 28% ATC [phone], back on vent per pt request, not tolerating PMV long CD  11/23/2024 0450 Full vent support, ACVC+. Tolerating well. OO  11/23/2024 1617 Pt has been on vent all day, attempted ATC but had increased wob CD  11/24/2024 0433 Full vent support, ACVC+. No changes. OO  11/24/2024 1041 28% ATC w/PMV=3hrs . Back to ACVC+ BP  11/25/2024 0439 Full vent support, ACVC+. OO  11/25/2024 0611 28%ATC w/PMV daytime BP  11/26/2024 0035 Full support on vent, ACVC+. Requesting frequent suctioning with small secretions. LN  11/26/2024 0554 28% ATC=1.5hrs, Back to ACVC+ BP  11/26/2024 2216 Pt back on vent post trach care [facility]  11/27/2024 1835 ATC 35% from 1100 until 1745 at which time patient was placed back on the vent. HL  11/28/2024 0003 Full vent support [facility]  11/28/2024 1733 Attempted to trial patient on ATC x2 today. Unable to tolerate being  off the vent for more than 15 minutes. Patient has been on full vent support today, up in the chair. HL  11/28/2024 2134 Full vent support [facility]  11/29/2024 1756 pt is to remain on full vent support, all weaning discontinued today by dr fernand [facility]  11/30/2024 0544 Full vent support, ACVC+ OO  11/30/2024 1725  full vent support CMG  12/01/2024 0441 Full vent support, ACVC+. No changes. OO  12/02/2024 0522 Full vent Support, ACVC+ OO  12/02/2024 0608 ACVC+ BP  12/03/2024 0457 Full vent support, ACVC+ OO  12/03/2024 0607 ACVC+ BP  12/03/2024 1155 Full support vent MM  12/03/24 1518 Pt with occ needs for sx,  otherwise on full support vent EA  12/05/24 0622 Full support vent KW  12/05/24 1222 Pt on full support.   EA  12/06/2024 0534 FULL SUPPORT KN  12/06/2024 1754 Remains on full vent support. HL  12/07/2024 0601 FULL SUPPORT KN  12/07/2024 1821 Patient remains on full vent support. HL  12/08/2024 0624 FULL SUPPORT KN  12/08/2024 1737 full vent support CMG  12/09/2024 0440 AC/VC+ DS  12/09/2024 1719 AC/VC+ CMG  12/10/2024 0033 AC/VC+ DS  12/10/2024 1408 ACVC+, 1 PRN Neb tx given due to wheezing NP  12/11/2024 0244 AC/VC+ DS  12/11/2024 1720 ACVC+ NP  12/12/2024 0316 ACVC+, same no changes. OO  12/12/2024 1750 Full support, VC+, no changes CD  12/13/2024 0539 FULL SUPPORT KN  12/13/2024 1745 Full support, no changes, pt did not ask to wean CD  12/14/2024 0527 FULL SUPPORT KN  12/14/2024 1445 Full support, no changes CD  12/14/2024 2223 Full vent support [facility]  12/15/2024 1727 Patient remains on full vent support. HL  12/15/2024 2319 Full vent support [facility]  12/16/2024 1705 Remains on Full Vent Support settings today. SJ  12/17/2024 0004 Pt remains on ACVC+. Pt stable but seems to be anxious. LN  12/17/2024 1544 ACVC+ NP  12/18/2024 0433 Full vent support, ACVC+ PER DR KHAN, per pt request ; can 6hrs ATC. OO  12/18/2024 1744 Patient requested ATC trial. Lasted 15 minutes and patient placed back on full vent support. HL  12/19/2024 0429 FULL SUPPORT KN  12/19/2024 1726 Remains on Full Support Ventilation today.    12/20/2024 0538 FULL SUPPORT KN  12/20/2024 1755 Trach collar wean for 25 minutes this afternoon. Neb requested and given. Back on full vent support at 1711. HL  12/21/2024 0516 FULL SUPPORT KN  12/21/2024 1742 Patient remains on full vent support. Rapid response called today due to low HR.  HL  12/21/2024 2317 Full vent support.  LN  12/22/2024 1712 Full vent support,pt did not request to wean today. CMG  12/22/2024 2310 Full ventilator support, ACVC+. LN  12/23/2024 14:00 Full  support ventilation ACVC+ [facility]  12/24/2024 0126 Full ventilator support, ACVC+ DS  12/24/2024 1747 Patient remains on full vent support. Rapid response called x2 today due to cardiac instability. HL  12/25/2024 0510 FULL SUPPORT KN  12/25/2024 1219 Full vent support LN  12/26/2024 0057 Full ventilator support, ACVC+ DS  12/26/2024 1506 ACVC+. No weaning. LN  12/26/2024 0505 FULL SUPPORT KN  12/27/2024 1723  full vent support CMG  12/28/2024 0442 FULL SUPPORT KN  12/28/2024 1734 Full Vent Support. Patient expressed concern regarding changing trach due to when it was inserted. Cesar at bedside and will consult Dr Fernand. CMG  12/29/2024 0510 FULL SUPPORT KN  12/29/2024 1728 Patient remains on full vent support. HL  12/30/2024 0446 Full ventilator support, ACVC+ DS  12/30/2024 1748 Patient remains of full vent support. Rapid response called this morning due to tachycardia and low O2 saturation. FiO2 increased to 40% at this time. HL  12/31/2024 0331 Full ventilator support, ACVC+ DS  12/31/2024 1554 Full ventilator support LN  01/01/2025 0458 Full ventilator support, ACVC+ DS  01/01/2025 1804 full vent support  CMG  01/01/2025 0433 FSV wean protocol per pt request [facility]  01/02/2025 1722 full vent support CMG  01/03/2025 0527 FULL VENT SUPPORT KN  01/03/2025 1900 FULL SUPPORT JM  01/04/2025 0521 FULL SUPPORT KN  01/04/2025 1339  FULL VENT SUPPORT JM  01/05/2025 0509 FULL SUPPORT KN  01/05/2025 1742 full ventilator support CMG  01/06/2025 0624 Full ventilator support, ACVC+ DS  01/06/2025 1717 Full vent support. AC/VC+ Pt weak today,did not request any weaning. CMG  01/06/2025 2153 Full vent support [facility]  01/07/2025 1057 Full vent support LN  01/07/2025 2238 Full ventilator support, ACVC+ DS  01/08/2025 0932 ACVC+, 40% NP  01/09/2025 0114 Full ventilator support, ACVC+ DS  01/09/2025 1825 Full support, no distress noted, increased Peep to 8 due to Sats dropping while sleeping CD  01/10/2025 0603 Full support KN   01/10/2025 1750 Full support CD  01/11/2025 0541 FULL SUPPORT KN  01/11/2025 1445 Full support, no distress noted CD  01/12/2025 0554 FULL SUPPORT KN                                                                                                Assessment/Plan   Acute on chronic respiratory failure with hypoxia the patient is on the ventilator full support she is at baseline on assist-control mode. Tracheostomy status continue routine tracheal care and stoma care.   ALS advanced disease no change clinically we will continue to monitor.   Chronic atrial fibrillation rate is controlled    I have personally evaluated the patient, evaluated the laboratory and imaging results and formulated the assessment and plan and placed orders as needed. The Patient requires high complexity decision making with multiple system involvement. Rounds were done with the Respiratory Therapy Director and respiratory therapist involved in the care of the patient as well as nursing staff.   Elfreda DELENA Bathe, MD [facility] Pulmonary Critical Care Medicine   This note is for inpatient care      [1] Allergies Allergen Reactions   Doxycycline Anaphylaxis and Other (See Comments)    Dizzy  Dizzy  Dizzy  Dizziness   Gatifloxacin Anaphylaxis, GI Intolerance and Other (See Comments)    WEAKNESS TO LEGS-FALLS, INABILITY TO STAND OR WALK.  Falling constantly into the street and walls  WEAKNESS TO LEGS-FALLS, INABILITY TO STAND OR WALK.  WEAKNESS TO LEGS-FALLS, INABILITY TO STAND OR WALK.  Falling constantly into the street and walls  WEAKNESS TO LEGS-FALLS, INABILITY TO STAND OR WALK. Falling constantly into the street and walls  Weakness to legs causing frequent falls; inability to stand or walk   Hydralazine  Palpitations   Iodine Anaphylaxis   Shellfish Protein-Containing Drug Products Anaphylaxis    AVOIDS IODINE PRODUCTS   Amlodipine Other (See Comments) and Swelling    SIDE  EFFECT:  LEG AND ANKLE SWELLING  SIDE EFFECT: LEG AND ANKLE SWELLING  SIDE EFFECT: LEG AND ANKLE SWELLING  SIDE EFFECT: LEG AND ANKLE SWELLING  SIDE EFFECT: LEG AND ANKLE SWELLING  SIDE EFFECT: LEG AND ANKLE SWELLING  SIDE EFFECT: LEG AND ANKLE SWELLING  SIDE EFFECT: LEG AND ANKLE SWELLING  SIDE EFFECT: LEG AND ANKLE SWELLING  Leg and ankle swelling   Diphenhydramine-Zinc Acetate Other (See Comments) and Rash    blisters  Blistering rash  Reaction to topical diphenhydramine   Naproxen Swelling   Penicillins Rash, GI Intolerance, Nausea Only and Other (See Comments)    Fever and rash  Fever and rash  Fever and rash  Other reaction(s): Redness  Reaction: Fever and rash, Redness  Fever and rash  Tolerated cephalosporins   Cashew Nut Oil Itching   Erythromycin Nausea Only  *Some images could not be shown."

## 2025-01-12 NOTE — Nursing Note (Signed)
 Mouthcare performed after PM meds

## 2025-01-12 NOTE — Progress Notes (Signed)
 Respiratory Progress Note Room #: 5E09  Admit Date: 10/09/2024  Pulmonologist:Dr Fernand  Discharge Date:  DX/HX: ALS, VDRF  Isolation:  Code Status: FULL   Airway Type? (Trach/ETT): Trach  Size: #6 Shiley  Cuffed/Cuffless?: cuffed  Placement at lip?:  High Risk Airway?:  Date of insertion: trached on 04/01/2024   Trach changed: 9/25 ? Per patient  PMV started: admitted on unit wearing pmv  Cap started:  Decannulated/extubated:   Ventilator ID Number: Current ventilator settings: AC/VC+, 14, 380, peep 8, fio2 40%  Current weaning orders: May wean up to 6 hrs ATC at pt's request  Date of first wean:  Date of liberation from vent:   Non-invasive Ventilation Current settings:   Home use? Yes/no:  Frequency? HS/PRN/Continuous:   Oxygen Therapy Modality? Nasal Cannula/NRB/Trach Collar/etc.:   FiO2:  LPM:    Treatments Drug: Dose: Frequency: Other:                        RT Progress Date Time Important Event or Update RT Initials  10/09/2024 1818 New admit, pt has vent at home. Wears 35% ATC w/PMV during the day and Vent HS. Arrived wearing PMV, ATC 35%. Tolerating well, ABG drawn--pH 7.37, Co2 56, Po2 60, Hco3 32.2, spo2 91%. pt stable. Suctions mouth with yankauer.States large amount of secretions at times. CMG  10/09/2024 2143 Pt asked to be placed on vent, did not tolerate SIMV, vent setting changed accordingly, ACVC+ 14 380 35% +5 [facility]  10/10/2024 1803 28% ATC w/PMV during daytime. Tolerated well, to go on vent hs CMG  10/11/2024 0515 28% ATC W/ PMV DAYS, VENT NIGHT KN  10/11/2024 1829 28% ATC W/PMV STARTED AT 0858 JM  10/12/2024 0510 28% ATC W/PMV DAYS,  VENT HS BEGAN ACVC 2025 KN  10/12/2024 1700 28% ATC W/PMV STARTED AT 0740  JM  10/13/2024 0256 ON AC/VC+ AT 2052 KN  10/13/2024 0557 28% ATC @0745 , 18hr daily goal, PMV for short periods BP  10/13/2024 1645 28% ATC w/PMV  goal for 18hrs ( started 0745) CMG  10/14/2024 0514 Placed on full vent support @2227  hrs due to  decreased SAT in the low 80's OO  10/14/2024 1709  28% ATC w/PMV during day, pt to rest on vent hs CMG  10/15/24 0538 Placed on full support ventilation at 2225 and patient requested to come off vent at 0530 and placed on 28% ATC. SAP  10/15/2024 1642 ATC day/vent at night, 28% ATC with PMV NP  10/16/24 0004 RCVD pt on 28% ATC, placed pt on FSV @ 2230 NP  10/16/2024 1149 ATC day/vent at night, 28% ATC with PMV NP  10/17/24 0527 28% ATC/PMV. Placed on full support ventilation at 2015. SAP  10/17/2024 1751 ATC 30% during day. Pt with bloody secretions, and are improving. Rapid called at 1625 due to tachycardia and increased wob, pt placed on full vent support. CMG  10/18/24 0603 Full support ventilation SAP  10/18/2024 1848 28% ATC w/PMV day, vent HS JM  10/19/2024 0509 Placed on full vent support @2055  hrs per request. Tolerating well. OO  10/19/2024 1536 28% ATC W/PMV DAYTIME FOR 93.25 HRS. BACK ON FULL VENT SUPPORT PER PT REQUEST DUE TO 'FEELING WEAK' JM  10/20/2024 0530 Full vent support. No changes. OO  10/20/2024 1807  28% ATC DAYTIME, PT REFUSED TT CHANGE JM  10/21/2024 0410 Placed on full vent support @2231  hrs per request by pt. OO  10/21/2024 0835 28% ATC w/PMV daytime BP  10/22/24 0218 Placed on full support ventilation at 2005 SAP  10/22/2024 0846 28% ATC w/PMV at 1025 BP  10/22/2024 2207 Pt went back on full vent support @2020  [facility]  10/23/2024 2314 Pt back on full vent support @ 2125 [facility]  10/23/2024 1728 ATC increased to 40%, 10L. PMV tolerated for short periods on and off, and while eating only. HL  10/24/2024 1758 Received patient on ATC 40%. Patient has been placing and removing the PMV as needed. Patient has declined to be suctioned this afternoon. No distress noted. HL  10/24/2024 2129 Per patients request, placed on full vent support [facility]  10/25/2024 1812 ATC 40% since 0850. PMV used PRN by patient.  HL  10/26/2024 0520 Full vent support @0014  hrs.  OO  10/26/2024 1725 1050 35% ATC  began for the day, pt used PMV short time today, said it was too hard to breathe with PMV CD  10/27/2024 0433 Full vent support @2046  hrs. OO  10/27/2024 0840 28% ATC w/PMV@0945  BP  10/28/2024 0200 Full support @2112  hrs . PRN neb given.  OO  10/28/2024 0844 28% ATC w/PMV@0845  BP  10/29/2024 0920 28% ATC w/PMV@0915  BP  10/29/2024 2116 Pt was on 28% ATC, went back on vent per her request @ 2031. [facility]  10/30/2024 1724 28% ATC w occasional PMV usage. Had it off the majority of the day CMG  10/30/2024 2209 28% ATC then vent for QHS [facility]  10/31/2024 1720 28% ATC days, PMV as tolerated, vent qhs CD  10/31/2024 2116 Pt on full vent support 2030 [facility]  11/01/2024 1728 28% ATC began 1025, PMV as tolerated, vent qhs CD  11/02/2024 0322 28% ATC DAYS W/ PMV,  VENT QHS KN  11/02/2024 1903 28% ATC DAYS W/ PMV   11/02/2024 2309 28% ATC W/ PMV, VENT QHS KN  11/03/2024 1754 28% ATC STARTED AT 0810, INTERMITTENT PMV USE JM  11/04/2024 0325 Increased FiO2 to 40% in response to adverse reaction to being placed on vent.  Titrated patient back to 28% ATC.  Patient is alert and oriented.  Placed patient on vent at 0230.  Patient tolerated vent settings well.  No signs of respiratory distress noted. DS  11/04/2024 1727 ATC 28%. PMV in place as tolerated. HL  11/05/2024 0642 Placed patient on vent at 0230.  Patient tolerated vent settings well.  No signs of respiratory distress noted. DS  11/05/2024 1536 Pt placed on 28% ATC at 0830 NP  11/05/2024 2149 28% ATC during day.  AC/VC during night DS  11/06/2024 1820 28% ATC started at 0750, vent at night NP  11/07/2024 0221 Placed back on vent at 2016 in full support for the night. Requests frequent suctioning with little secretions. LN  11/07/2024 1730 28% ATC days, PMV as tolerated, vent qhs CD  11/08/2024 0536 28% ATC W/ PMV DAYS, VENT QHS KN  11/08/2024 1725 28% ATC days, PMV as tolerated, vent at bedtime, pulse ox was not picking up this afternoon so RT put it on ear for reading, pt  said she doesn't want it on ear or toes, placed on forehead but pt took it off CD  11/09/2024 0516 28% ATC W/ PMV DAYS, VENT QHS KN  11/09/2024 1716 ATC 28%. PMV used PRN by patient.  HL  11/10/2024 0236 28% ATC W/PMV DAYTIME, VENT AT NIGHT KN  11/10/2024 0730 28% ATC w/PMV@0730  BP  11/11/2024 0108 Patient placed on vent at 0000 DS  11/11/2024 0615 28% ATC w/PMV@0730  BP  11/12/2024 1217 Pt placed  back on full support on the vent for the night LN  11/12/2024 0606 28% ATC w/PMV@0745  BP  11/12/2024 2339 ATC wean trial ended at 2320; placed patient on full support AC/VC+ DS  11/13/2024 1723 ATC 28% started at 1010. PMV used PRN. HL  11/14/2024 0442 Placed on full support vent when PT went to bed around 2200.  SB   11/14/2024 1756 Placed on ATC 28% @ 0815 without issue. Patient has declined to be suctioned twice this afternoon. PMV in place at this time. HL  11/15/2024 0512 28% ATC W/PMV DAYS, VENT AT NIGHT, PLACED ON AC/VC+ @ 2300 KN  11/15/2024 1508 Placed pt on ATC 28% w/PMV with goal being during the day. To rest on full vent setting HS CMG  11/16/2024 0529 28% ATC W/PMV DAYS, VENT AT NIGHT, AC/VC+ 2201 KN  11/16/2024 1745 28% ATC w/pmv day, vent hs. Patient requested to go on vent for a few hours for a break today. CMG  11/17/2024 0538 Placed back on vent at 2043 to rest LN  11/17/2024 0615 28% ATC w/PMV@1012  BP  11/18/2024 0058 Placed back on vent at 2136 to rest LN  11/18/2024 1452 28% ATC with PMV, started at 1020. NP  11/18/2024 2204 Per pt request, back on full vent support post trach care [facility]  11/19/2024 1551 Pt on 28% ATC from [phone]. Back to vent on full support due to difficulty breathing, increased secretions. NP  11/19/2024 2123 Pt is on full vent support [facility]  11/20/2024 1707 Pt back on vent for 40 mins today due to increased work of breathing. 28% ATC (PMV on/off throughout the day) NP  11/21/2024 0642 ATC wean trial ended at 2318; placed patient on full support AC/VC+ DS   11/21/2024 1735 28% ATC until 1445 (PMV not on long), pt asked to go back on vent CD  11/21/2024 2359 Pt is full vent support [facility]  11/22/2024 1745 28% ATC [phone], back on vent per pt request, not tolerating PMV long CD  11/23/2024 0450 Full vent support, ACVC+. Tolerating well. OO  11/23/2024 1617 Pt has been on vent all day, attempted ATC but had increased wob CD  11/24/2024 0433 Full vent support, ACVC+. No changes. OO  11/24/2024 1041 28% ATC w/PMV=3hrs . Back to ACVC+ BP  11/25/2024 0439 Full vent support, ACVC+. OO  11/25/2024 0611 28%ATC w/PMV daytime BP  11/26/2024 0035 Full support on vent, ACVC+. Requesting frequent suctioning with small secretions. LN  11/26/2024 0554 28% ATC=1.5hrs, Back to ACVC+ BP  11/26/2024 2216 Pt back on vent post trach care [facility]  11/27/2024 1835 ATC 35% from 1100 until 1745 at which time patient was placed back on the vent. HL  11/28/2024 0003 Full vent support [facility]  11/28/2024 1733 Attempted to trial patient on ATC x2 today. Unable to tolerate being off the vent for more than 15 minutes. Patient has been on full vent support today, up in the chair. HL  11/28/2024 2134 Full vent support [facility]  11/29/2024 1756 pt is to remain on full vent support, all weaning discontinued today by dr fernand [facility]  11/30/2024 0544 Full vent support, ACVC+ OO  11/30/2024 1725  full vent support CMG  12/01/2024 0441 Full vent support, ACVC+. No changes. OO  12/02/2024 0522 Full vent Support, ACVC+ OO  12/02/2024 0608 ACVC+ BP  12/03/2024 0457 Full vent support, ACVC+ OO  12/03/2024 0607 ACVC+ BP  12/03/2024 1155 Full support vent MM  12/03/24 1518 Pt with occ needs for sx, otherwise on  full support vent EA  12/05/24 0622 Full support vent KW  12/05/24 1222 Pt on full support.   EA  12/06/2024 0534 FULL SUPPORT KN  12/06/2024 1754 Remains on full vent support. HL  12/07/2024 0601 FULL SUPPORT KN  12/07/2024 1821 Patient remains on full vent support. HL  12/08/2024 0624 FULL SUPPORT KN   12/08/2024 1737 full vent support CMG  12/09/2024 0440 AC/VC+ DS  12/09/2024 1719 AC/VC+ CMG  12/10/2024 0033 AC/VC+ DS  12/10/2024 1408 ACVC+, 1 PRN Neb tx given due to wheezing NP  12/11/2024 0244 AC/VC+ DS  12/11/2024 1720 ACVC+ NP  12/12/2024 0316 ACVC+, same no changes. OO  12/12/2024 1750 Full support, VC+, no changes CD  12/13/2024 0539 FULL SUPPORT KN  12/13/2024 1745 Full support, no changes, pt did not ask to wean CD  12/14/2024 0527 FULL SUPPORT KN  12/14/2024 1445 Full support, no changes CD  12/14/2024 2223 Full vent support [facility]  12/15/2024 1727 Patient remains on full vent support. HL  12/15/2024 2319 Full vent support [facility]  12/16/2024 1705 Remains on Full Vent Support settings today. SJ  12/17/2024 0004 Pt remains on ACVC+. Pt stable but seems to be anxious. LN  12/17/2024 1544 ACVC+ NP  12/18/2024 0433 Full vent support, ACVC+ PER DR KHAN, per pt request ; can 6hrs ATC. OO  12/18/2024 1744 Patient requested ATC trial. Lasted 15 minutes and patient placed back on full vent support. HL  12/19/2024 0429 FULL SUPPORT KN  12/19/2024 1726 Remains on Full Support Ventilation today.   12/20/2024 0538 FULL SUPPORT KN  12/20/2024 1755 Trach collar wean for 25 minutes this afternoon. Neb requested and given. Back on full vent support at 1711. HL  12/21/2024 0516 FULL SUPPORT KN  12/21/2024 1742 Patient remains on full vent support. Rapid response called today due to low HR.  HL  12/21/2024 2317 Full vent support.  LN  12/22/2024 1712 Full vent support,pt did not request to wean today. CMG  12/22/2024 2310 Full ventilator support, ACVC+. LN  12/23/2024 14:00 Full support ventilation ACVC+ [facility]  12/24/2024 0126 Full ventilator support, ACVC+ DS  12/24/2024 1747 Patient remains on full vent support. Rapid response called x2 today due to cardiac instability. HL  12/25/2024 0510 FULL SUPPORT KN  12/25/2024 1219 Full vent support LN  12/26/2024 0057 Full ventilator support, ACVC+ DS   12/26/2024 1506 ACVC+. No weaning. LN  12/26/2024 0505 FULL SUPPORT KN  12/27/2024 1723  full vent support CMG  12/28/2024 0442 FULL SUPPORT KN  12/28/2024 1734 Full Vent Support. Patient expressed concern regarding changing trach due to when it was inserted. Cesar at bedside and will consult Dr Fernand. CMG  12/29/2024 0510 FULL SUPPORT KN  12/29/2024 1728 Patient remains on full vent support. HL  12/30/2024 0446 Full ventilator support, ACVC+ DS  12/30/2024 1748 Patient remains of full vent support. Rapid response called this morning due to tachycardia and low O2 saturation. FiO2 increased to 40% at this time. HL  12/31/2024 0331 Full ventilator support, ACVC+ DS  12/31/2024 1554 Full ventilator support LN  01/01/2025 0458 Full ventilator support, ACVC+ DS  01/01/2025 1804 full vent support  CMG  01/01/2025 0433 FSV wean protocol per pt request [facility]  01/02/2025 1722 full vent support CMG  01/03/2025 0527 FULL VENT SUPPORT KN  01/03/2025 1900 FULL SUPPORT JM  01/04/2025 0521 FULL SUPPORT KN  01/04/2025 1339  FULL VENT SUPPORT JM  01/05/2025 0509 FULL SUPPORT KN  01/05/2025 1742 full ventilator support CMG  01/06/2025  9375 Full ventilator support, ACVC+ DS  01/06/2025 1717 Full vent support. AC/VC+ Pt weak today,did not request any weaning. CMG  01/06/2025 2153 Full vent support [facility]  01/07/2025 1057 Full vent support LN  01/07/2025 2238 Full ventilator support, ACVC+ DS  01/08/2025 0932 ACVC+, 40% NP  01/09/2025 0114 Full ventilator support, ACVC+ DS  01/09/2025 1825 Full support, no distress noted, increased Peep to 8 due to Sats dropping while sleeping CD  01/10/2025 0603 Full support KN  01/10/2025 1750 Full support CD  01/11/2025 0541 FULL SUPPORT KN  01/11/2025 1445 Full support, no distress noted CD  01/12/2025 0554 FULL SUPPORT KN  01/12/2025 1833 Remains on full vent support. HL

## 2025-01-13 ENCOUNTER — Emergency Department (HOSPITAL_COMMUNITY)
Admission: EM | Admit: 2025-01-13 | Discharge: 2025-01-13 | Disposition: A | Discharge status: Rehabilitation Facility | Attending: Emergency Medicine | Admitting: Emergency Medicine

## 2025-01-13 ENCOUNTER — Encounter (HOSPITAL_COMMUNITY): Payer: Self-pay

## 2025-01-13 ENCOUNTER — Other Ambulatory Visit: Payer: Self-pay

## 2025-01-13 DIAGNOSIS — Z430 Encounter for attention to tracheostomy: Secondary | ICD-10-CM | POA: Diagnosis not present

## 2025-01-13 DIAGNOSIS — J9621 Acute and chronic respiratory failure with hypoxia: Secondary | ICD-10-CM | POA: Diagnosis not present

## 2025-01-13 DIAGNOSIS — G1221 Amyotrophic lateral sclerosis: Secondary | ICD-10-CM | POA: Insufficient documentation

## 2025-01-13 DIAGNOSIS — Z930 Tracheostomy status: Secondary | ICD-10-CM | POA: Diagnosis not present

## 2025-01-13 DIAGNOSIS — I482 Chronic atrial fibrillation, unspecified: Secondary | ICD-10-CM | POA: Diagnosis not present

## 2025-01-13 NOTE — ED Notes (Signed)
 Patient given a cup of water  to use with swab sticks.  Lights turned off at patient's request.

## 2025-01-13 NOTE — ED Notes (Signed)
 ENT provider at bedside changing trach

## 2025-01-13 NOTE — Discharge Instructions (Addendum)
 It appears that she is attempting to be arranged for you to follow-up with the neurologist at [facility]s information has been given to you here.

## 2025-01-13 NOTE — ED Provider Notes (Signed)
 " [place] EMERGENCY DEPARTMENT [facility] Provider Note   CSN: [id number] Arrival date & time: 01/13/25  1248     Patient presents with: No chief complaint on file.   Dominique Brewer is a 79 y.o. female.   Yeah yeah yeah thank yeah good    Patient is trached.  History of severe ALS.  Vent dependent.  [facility] for [facility] change.  Discussed with patient states that [facility] at [facility] are unable to get [facility].  It was last changed reportedly in September and placed in March of last year.   Past Medical History:  Diagnosis Date   Respiratory failure (HCC)   ALS  Prior to Admission medications  Medication Sig Start Date End Date Taking? Authorizing Provider  acetaminophen  (TYLENOL ) 325 MG tablet Place 650 mg into feeding tube every 6 (six) hours as needed (pain).    [provider]  arformoterol  (BROVANA ) 15 MCG/2ML NEBU Take 2 mLs (15 mcg total) by nebulization 2 (two) times daily. 07/14/24   Rashid, Farhan, MD  carboxymethylcellulose (REFRESH PLUS) 0.5 % SOLN Place 1 drop into both eyes every 6 (six) hours as needed (dry eye).    [provider]  Chlorhexidine  Gluconate Cloth 2 % PADS Apply 6 each topically daily. 07/15/24   Rashid, Farhan, MD  diltiazem  (CARDIZEM ) 30 MG tablet Place 1 tablet (30 mg total) into feeding tube every 8 (eight) hours. 07/14/24   Rashid, Farhan, MD  enoxaparin  (LOVENOX ) 30 MG/0.3ML injection Inject 0.3 mLs (30 mg total) into the skin daily. 05/30/24   Jenna Maude BRAVO, NP  famotidine  (PEPCID ) 20 MG tablet Place 20 mg into feeding tube daily.    [provider]  guaiFENesin 200 MG tablet Place 200 mg into feeding tube every 6 (six) hours as needed for cough or to loosen phlegm.    [provider]  hydrOXYzine  (ATARAX ) 10 MG/5ML syrup Place 5 mLs (10 mg total) into feeding tube 3 (three) times daily as needed (breakthrough anxiety). 07/14/24   Rashid, Farhan, MD   ipratropium-albuterol  (DUONEB) 0.5-2.5 (3) MG/3ML SOLN Take 3 mLs by nebulization every 4 (four) hours as needed. 07/14/24   Rashid, Farhan, MD  latanoprost  (XALATAN ) 0.005 % ophthalmic solution Place 1 drop into both eyes at bedtime.    [provider]  loperamide (IMODIUM A-D) 2 MG tablet Place 2 mg into feeding tube every 6 (six) hours as needed for diarrhea or loose stools.    [provider]  midodrine  (PROAMATINE ) 5 MG tablet Place 3 tablets (15 mg total) into feeding tube every 8 (eight) hours. 05/30/24   Babcock, Peter E, NP  Mouthwashes (MOUTH RINSE) LIQD solution 15 mLs by Mouth Rinse route as needed (oral care). 07/14/24   Rashid, Farhan, MD  Nutritional Supplements (JEVITY 1.5 CAL PO) 55 mL/hr by Feeding Tube route continuous. May be off for up to 2 hours daily for ADL's and activities. Total for day 1210-1333mLs (1815-1980kCal).    [provider]  Nystatin (GERHARDT'S BUTT CREAM) CREA Apply 1 Application topically as needed for irritation. 07/14/24   Rashid, Farhan, MD  ondansetron  (ZOFRAN ) 4 MG tablet Place 4 mg into feeding tube every 6 (six) hours as needed for nausea or vomiting.    [provider]  Protein (FEEDING SUPPLEMENT, PROSOURCE TF20,) liquid Place 60 mLs into feeding tube daily. 07/15/24   Rashid, Farhan, MD  QUEtiapine  (SEROQUEL ) 25 MG tablet Place 1 tablet (25 mg  total) into feeding tube at bedtime. 07/14/24   Rashid, Farhan, MD  senna (SENOKOT) 8.6 MG TABS tablet Place 1 tablet (8.6 mg total) into feeding tube daily. 07/15/24   Rashid, Farhan, MD  sertraline  (ZOLOFT ) 50 MG tablet Place 1 tablet (50 mg total) into feeding tube daily. 07/15/24   Rashid, Farhan, MD  thiamine  (VITAMIN B1) 100 MG tablet Place 100 mg into feeding tube daily.    [provider]  umeclidinium bromide  (INCRUSE ELLIPTA ) 62.5 MCG/ACT AEPB Inhale 1 puff into the lungs daily. 07/14/24   Rashid, Farhan, MD  Water  For Irrigation, Sterile (FREE WATER ) SOLN Place 150  mLs into feeding tube every 6 (six) hours. 07/14/24   Rashid, Farhan, MD    Allergies: Iodine, Shellfish allergy, Zymaxid [gatifloxacin], Amlodipine, Aleve [naproxen], Adoxa [doxycycline], Ak-mycin [erythromycin], Benadryl itch stopping [diphenhydramine-zinc acetate], Codeine, and Penicillins    Review of Systems  Updated Vital Signs BP 126/70 (BP Location: Right Arm)   Pulse 78   Temp 98.1 F (36.7 C)   Resp 18   Ht 5' 4 (1.626 m)   SpO2 100%   BMI 15.63 kg/m   Physical Exam Vitals and nursing note reviewed.  Neck:     Comments: 6.  Shiley trach in place. Neurological:     Mental Status: She is alert. Mental status is at baseline.     (all labs ordered are listed, but only abnormal results are displayed) Labs Reviewed - No data to display  EKG: None  Radiology: No results found.   Procedures   Medications Ordered in the ED - No data to display                                  Medical Decision Making  Patient sent for trach exchange by ENT.  Reviewed notes from [facility] where trach was placed.  Discussed with respiratory therapy and they state trach will need to be changed by ENT.  Will discuss with ENT on-call, Dr. Roark.  Discussed with Dr. Roark.  States he was called by [facility] and had told them that the ER is not the place for routine trach changes.  However he will come and see her once his clinic is done.  Informed patient.   Care will be turned over to Dr. Gennaro      Final diagnoses:  ALS (amyotrophic lateral sclerosis) [facility])  Encounter for tracheostomy tube change [facility])    ED Discharge Orders     None          Patsey Lot, MD 01/13/25 1439  "

## 2025-01-13 NOTE — ED Provider Notes (Signed)
 I took over care of this patient at 3:30 PM.  She was sent down from [facility] for [facility] change by ENT specifically. Physical Exam  BP 126/70 (BP Location: Right Arm)   Pulse 78   Temp 98.1 F (36.7 C)   Resp 18   Ht 5' 4 (1.626 m)   SpO2 100%   BMI 15.63 kg/m    ED Course / MDM    Medical Decision Making Patient had trach changed by ENT, Dr. Gwenn without any difficulty.  [facility], Dr. Angelia is accepting.          Gennaro Duwaine CROME, DO 01/13/25 2129

## 2025-01-13 NOTE — ED Triage Notes (Signed)
 [facility]. [facility] reports they were unable to change ger [facility] and needed a ENT consult.

## 2025-01-13 NOTE — ED Notes (Signed)
 Patient given wet soapy rag to wash hands.

## 2025-01-14 ENCOUNTER — Inpatient Hospital Stay: Admit: 2025-01-14 | Source: Home / Self Care | Admitting: Internal Medicine

## 2025-01-14 DIAGNOSIS — J9621 Acute and chronic respiratory failure with hypoxia: Secondary | ICD-10-CM | POA: Diagnosis not present

## 2025-01-14 DIAGNOSIS — G1221 Amyotrophic lateral sclerosis: Secondary | ICD-10-CM | POA: Diagnosis not present

## 2025-01-14 DIAGNOSIS — Z430 Encounter for attention to tracheostomy: Secondary | ICD-10-CM

## 2025-01-14 DIAGNOSIS — I482 Chronic atrial fibrillation, unspecified: Secondary | ICD-10-CM | POA: Diagnosis not present

## 2025-01-14 DIAGNOSIS — Z930 Tracheostomy status: Secondary | ICD-10-CM | POA: Diagnosis not present

## 2025-01-14 LAB — CBC WITH DIFFERENTIAL/PLATELET
Abs Immature Granulocytes: 0.15 K/uL — ABNORMAL HIGH (ref 0.00–0.07)
Basophils Absolute: 0.1 K/uL (ref 0.0–0.1)
Basophils Relative: 0 %
Eosinophils Absolute: 0 K/uL (ref 0.0–0.5)
Eosinophils Relative: 0 %
HCT: 40.4 % (ref 36.0–46.0)
Hemoglobin: 12.1 g/dL (ref 12.0–15.0)
Immature Granulocytes: 1 %
Lymphocytes Relative: 3 %
Lymphs Abs: 0.6 K/uL — ABNORMAL LOW (ref 0.7–4.0)
MCH: 27.1 pg (ref 26.0–34.0)
MCHC: 30 g/dL (ref 30.0–36.0)
MCV: 90.6 fL (ref 80.0–100.0)
Monocytes Absolute: 0.8 K/uL (ref 0.1–1.0)
Monocytes Relative: 4 %
Neutro Abs: 18.1 K/uL — ABNORMAL HIGH (ref 1.7–7.7)
Neutrophils Relative %: 92 %
Platelets: 362 K/uL (ref 150–400)
RBC: 4.46 MIL/uL (ref 3.87–5.11)
RDW: 16.2 % — ABNORMAL HIGH (ref 11.5–15.5)
WBC: 19.7 K/uL — ABNORMAL HIGH (ref 4.0–10.5)
nRBC: 0 % (ref 0.0–0.2)

## 2025-01-14 LAB — COMPREHENSIVE METABOLIC PANEL WITH GFR
ALT: 14 U/L (ref 0–44)
AST: 20 U/L (ref 15–41)
Albumin: 3.3 g/dL — ABNORMAL LOW (ref 3.5–5.0)
Alkaline Phosphatase: 88 U/L (ref 38–126)
Anion gap: 16 — ABNORMAL HIGH (ref 5–15)
BUN: 30 mg/dL — ABNORMAL HIGH (ref 8–23)
CO2: 28 mmol/L (ref 22–32)
Calcium: 10.1 mg/dL (ref 8.9–10.3)
Chloride: 103 mmol/L (ref 98–111)
Creatinine, Ser: 0.43 mg/dL — ABNORMAL LOW (ref 0.44–1.00)
GFR, Estimated: >60 mL/min
Glucose, Bld: 72 mg/dL (ref 70–99)
Potassium: 4.4 mmol/L (ref 3.5–5.1)
Sodium: 147 mmol/L — ABNORMAL HIGH (ref 135–145)
Total Bilirubin: 0.3 mg/dL (ref 0.0–1.2)
Total Protein: 7.8 g/dL (ref 6.5–8.1)

## 2025-01-14 LAB — MAGNESIUM: Magnesium: 2.1 mg/dL (ref 1.7–2.4)

## 2025-01-14 LAB — PHOSPHORUS: Phosphorus: 3.3 mg/dL (ref 2.5–4.6)

## 2025-01-14 NOTE — Consult Note (Signed)
 Reason for Consult: Trach change Referring Physician: Dr. Fleeta Finger  Dominique Brewer is an 79 y.o. female.  HPI: Patient with a chronic ventilator that had a trach change in September.  She was transferred from a another facility to specialty care.  I was called about changing the trach and they have no one available to perform this trach care.  Apparently the trach change was difficult.  The sent the patient to our emergency room for that routine treatment.  The patient is not having any issues with the trach.  Past Medical History:  Diagnosis Date   Respiratory failure [facility])     Past Surgical History:  Procedure Laterality Date   TRACHEOSTOMY      No family history on file.  Social History:  reports that she has never smoked. She has never used smokeless tobacco. She reports that she does not currently use alcohol. She reports that she does not currently use drugs.  Allergies: Allergies[1]   No results found for this or any previous visit (from the past 48 hours).  No results found.  ROS There were no vitals taken for this visit. Physical Exam Constitutional:      Appearance: Normal appearance.  HENT:     Head: Normocephalic and atraumatic.     Right Ear: Tympanic membrane is without lesions and middle ear aerated, ear canal and external ear normal.     Left Ear: Tympanic membrane is without lesions and middle ear aerated, ear canal and external ear normal.     Nose: Nose without deviation of septum.  Turbinates with mild hypertrophy, No significant swelling or masses.     Oral cavity/oropharynx: Mucous membranes are moist. No lesions or masses    Larynx: no phonation    Eyes:     Extraocular Movements: Extraocular movements intact.     Conjunctiva/sclera: Conjunctivae normal.     Pupils: Pupils are equal, round, and reactive to light.  Cardiovascular:     Rate and Rhythm: Normal rate.  Pulmonary:     Effort: Pulmonary effort is normal.  Musculoskeletal:     Cervical  back: Normal range of motion and neck supple. No rigidity.  Lymphadenopathy:     Cervical: The trach is in place.  It has some granulation tissue around it.  It is a #6 cuffed.  She is on the ventilator.  There is good airflow.  The patient looks good and communicates through writing.  She is completely alert   salivary glands- no mass or swelling Neurological:     Mental Status: He is alert. CN 2-12 intact. No nystagmus   Trach change  The patient had the trach in place.  The trach was freed of the Velcro trach collar and the cuff was deflated.  The old trach was removed.  The granulation tissue was mostly just lateral around the skin.  A #6 Shiley tube was placed without difficulty.  The balloon was inflated.  She was connected back to the ventilator and had good end-tidal CO2 return and good volume.  She tolerated this well.   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  This facility needs to have somebody available to perform the routine care for these types of things and should not utilize the emergency room as a routine clinic visit.  The trach was changed without difficulty  to a #6 cuffed.  Patient tolerated that well.  She was sent back to her facility.  [name] 01/14/2025, 10:09 AM        [1]  Allergies Allergen Reactions   Iodine Anaphylaxis   Shellfish Allergy Anaphylaxis    Due to iodine content   Zymaxid [Gatifloxacin] Other (See Comments)    Weakness to legs causing frequent falls; inability to stand or walk   Amlodipine Swelling    Leg and ankle swelling    Aleve [Naproxen] Swelling   Adoxa [Doxycycline] Other (See Comments)    Dizziness    Ak-Mycin [Erythromycin] Nausea Only   Benadryl Itch Stopping [Diphenhydramine-Zinc Acetate] Rash    Blistering rash Reaction to topical diphenhydramine    Codeine  Other (See Comments)    Unknown reaction   Penicillins Rash    Fever and rash Tolerated cephalosporins

## 2025-01-15 ENCOUNTER — Other Ambulatory Visit (HOSPITAL_COMMUNITY)

## 2025-01-15 DIAGNOSIS — Z930 Tracheostomy status: Secondary | ICD-10-CM | POA: Diagnosis not present

## 2025-01-15 DIAGNOSIS — J9621 Acute and chronic respiratory failure with hypoxia: Secondary | ICD-10-CM | POA: Diagnosis not present

## 2025-01-15 DIAGNOSIS — I482 Chronic atrial fibrillation, unspecified: Secondary | ICD-10-CM | POA: Diagnosis not present

## 2025-01-15 DIAGNOSIS — G1221 Amyotrophic lateral sclerosis: Secondary | ICD-10-CM | POA: Diagnosis not present

## 2025-01-16 LAB — CBC
HCT: 34.6 % — ABNORMAL LOW (ref 36.0–46.0)
Hemoglobin: 10.8 g/dL — ABNORMAL LOW (ref 12.0–15.0)
MCH: 27.7 pg (ref 26.0–34.0)
MCHC: 31.2 g/dL (ref 30.0–36.0)
MCV: 88.7 fL (ref 80.0–100.0)
Platelets: 345 K/uL (ref 150–400)
RBC: 3.9 MIL/uL (ref 3.87–5.11)
RDW: 16.4 % — ABNORMAL HIGH (ref 11.5–15.5)
WBC: 12.9 K/uL — ABNORMAL HIGH (ref 4.0–10.5)
nRBC: 0 % (ref 0.0–0.2)

## 2025-01-17 LAB — CULTURE, RESPIRATORY W GRAM STAIN

## 2025-01-18 LAB — VITAMIN D 25 HYDROXY (VIT D DEFICIENCY, FRACTURES): Vit D, 25-Hydroxy: 27.1 ng/mL — ABNORMAL LOW (ref 30–100)

## 2025-01-21 LAB — BASIC METABOLIC PANEL WITH GFR
Anion gap: 6 (ref 5–15)
BUN: 18 mg/dL (ref 8–23)
CO2: 35 mmol/L — ABNORMAL HIGH (ref 22–32)
Calcium: 9.3 mg/dL (ref 8.9–10.3)
Chloride: 97 mmol/L — ABNORMAL LOW (ref 98–111)
Creatinine, Ser: 0.32 mg/dL — ABNORMAL LOW (ref 0.44–1.00)
GFR, Estimated: >60 mL/min
Glucose, Bld: 116 mg/dL — ABNORMAL HIGH (ref 70–99)
Potassium: 4.5 mmol/L (ref 3.5–5.1)
Sodium: 138 mmol/L (ref 135–145)

## 2025-01-21 LAB — CBC WITH DIFFERENTIAL/PLATELET
Abs Immature Granulocytes: 0.19 10*3/uL — ABNORMAL HIGH (ref 0.00–0.07)
Basophils Absolute: 0.1 10*3/uL (ref 0.0–0.1)
Basophils Relative: 0 %
Eosinophils Absolute: 0.1 10*3/uL (ref 0.0–0.5)
Eosinophils Relative: 0 %
HCT: 29.2 % — ABNORMAL LOW (ref 36.0–46.0)
Hemoglobin: 9.5 g/dL — ABNORMAL LOW (ref 12.0–15.0)
Immature Granulocytes: 1 %
Lymphocytes Relative: 4 %
Lymphs Abs: 0.8 10*3/uL (ref 0.7–4.0)
MCH: 27.9 pg (ref 26.0–34.0)
MCHC: 32.5 g/dL (ref 30.0–36.0)
MCV: 85.6 fL (ref 80.0–100.0)
Monocytes Absolute: 1.9 10*3/uL — ABNORMAL HIGH (ref 0.1–1.0)
Monocytes Relative: 9 %
Neutro Abs: 18.9 10*3/uL — ABNORMAL HIGH (ref 1.7–7.7)
Neutrophils Relative %: 86 %
Platelets: 346 10*3/uL (ref 150–400)
RBC: 3.41 MIL/uL — ABNORMAL LOW (ref 3.87–5.11)
RDW: 16 % — ABNORMAL HIGH (ref 11.5–15.5)
WBC: 21.9 10*3/uL — ABNORMAL HIGH (ref 4.0–10.5)
nRBC: 0 % (ref 0.0–0.2)

## 2025-01-21 LAB — MAGNESIUM: Magnesium: 1.8 mg/dL (ref 1.7–2.4)

## 2025-01-21 LAB — PROCALCITONIN: Procalcitonin: 0.29 ng/mL

## 2025-01-21 LAB — C-REACTIVE PROTEIN: CRP: 19.5 mg/dL — ABNORMAL HIGH

## 2025-01-24 LAB — COMPREHENSIVE METABOLIC PANEL WITH GFR
ALT: 24 U/L (ref 0–44)
AST: 24 U/L (ref 15–41)
Albumin: 2.8 g/dL — ABNORMAL LOW (ref 3.5–5.0)
Alkaline Phosphatase: 84 U/L (ref 38–126)
Anion gap: 8 (ref 5–15)
BUN: 24 mg/dL — ABNORMAL HIGH (ref 8–23)
CO2: 33 mmol/L — ABNORMAL HIGH (ref 22–32)
Calcium: 9.8 mg/dL (ref 8.9–10.3)
Chloride: 100 mmol/L (ref 98–111)
Creatinine, Ser: <0.3 mg/dL — ABNORMAL LOW (ref 0.44–1.00)
Glucose, Bld: 85 mg/dL (ref 70–99)
Potassium: 4.9 mmol/L (ref 3.5–5.1)
Sodium: 140 mmol/L (ref 135–145)
Total Bilirubin: 0.2 mg/dL (ref 0.0–1.2)
Total Protein: 7.3 g/dL (ref 6.5–8.1)

## 2025-01-24 LAB — CULTURE, RESPIRATORY W GRAM STAIN

## 2025-01-24 LAB — CBC
HCT: 31 % — ABNORMAL LOW (ref 36.0–46.0)
Hemoglobin: 9.8 g/dL — ABNORMAL LOW (ref 12.0–15.0)
MCH: 27.4 pg (ref 26.0–34.0)
MCHC: 31.6 g/dL (ref 30.0–36.0)
MCV: 86.6 fL (ref 80.0–100.0)
Platelets: 461 10*3/uL — ABNORMAL HIGH (ref 150–400)
RBC: 3.58 MIL/uL — ABNORMAL LOW (ref 3.87–5.11)
RDW: 16 % — ABNORMAL HIGH (ref 11.5–15.5)
WBC: 12.1 10*3/uL — ABNORMAL HIGH (ref 4.0–10.5)
nRBC: 0 % (ref 0.0–0.2)

## 2025-01-27 DIAGNOSIS — J9621 Acute and chronic respiratory failure with hypoxia: Secondary | ICD-10-CM

## 2025-01-27 DIAGNOSIS — G1221 Amyotrophic lateral sclerosis: Secondary | ICD-10-CM

## 2025-01-27 DIAGNOSIS — Z930 Tracheostomy status: Secondary | ICD-10-CM

## 2025-01-27 DIAGNOSIS — I482 Chronic atrial fibrillation, unspecified: Secondary | ICD-10-CM

## 2025-01-28 DIAGNOSIS — Z930 Tracheostomy status: Secondary | ICD-10-CM

## 2025-01-28 DIAGNOSIS — G1221 Amyotrophic lateral sclerosis: Secondary | ICD-10-CM

## 2025-01-28 DIAGNOSIS — I482 Chronic atrial fibrillation, unspecified: Secondary | ICD-10-CM

## 2025-01-28 DIAGNOSIS — J9621 Acute and chronic respiratory failure with hypoxia: Secondary | ICD-10-CM

## 2025-01-29 LAB — PHOSPHORUS: Phosphorus: 2.5 mg/dL (ref 2.5–4.6)

## 2025-01-29 LAB — COMPREHENSIVE METABOLIC PANEL WITH GFR
ALT: 23 U/L (ref 0–44)
AST: 20 U/L (ref 15–41)
Albumin: 3 g/dL — ABNORMAL LOW (ref 3.5–5.0)
Alkaline Phosphatase: 83 U/L (ref 38–126)
Anion gap: 5 (ref 5–15)
BUN: 25 mg/dL — ABNORMAL HIGH (ref 8–23)
CO2: 34 mmol/L — ABNORMAL HIGH (ref 22–32)
Calcium: 9.6 mg/dL (ref 8.9–10.3)
Chloride: 100 mmol/L (ref 98–111)
Creatinine, Ser: 0.34 mg/dL — ABNORMAL LOW (ref 0.44–1.00)
GFR, Estimated: >60 mL/min
Glucose, Bld: 273 mg/dL — ABNORMAL HIGH (ref 70–99)
Potassium: 4.4 mmol/L (ref 3.5–5.1)
Sodium: 140 mmol/L (ref 135–145)
Total Bilirubin: <0.2 mg/dL (ref 0.0–1.2)
Total Protein: 7 g/dL (ref 6.5–8.1)

## 2025-01-29 LAB — CBC
HCT: 29 % — ABNORMAL LOW (ref 36.0–46.0)
Hemoglobin: 9.3 g/dL — ABNORMAL LOW (ref 12.0–15.0)
MCH: 27.6 pg (ref 26.0–34.0)
MCHC: 32.1 g/dL (ref 30.0–36.0)
MCV: 86.1 fL (ref 80.0–100.0)
Platelets: 440 10*3/uL — ABNORMAL HIGH (ref 150–400)
RBC: 3.37 MIL/uL — ABNORMAL LOW (ref 3.87–5.11)
RDW: 16.3 % — ABNORMAL HIGH (ref 11.5–15.5)
WBC: 7.9 10*3/uL (ref 4.0–10.5)
nRBC: 0 % (ref 0.0–0.2)

## 2025-01-29 LAB — MAGNESIUM: Magnesium: 2 mg/dL (ref 1.7–2.4)
